# Patient Record
Sex: Male | Born: 1945 | Race: White | Hispanic: No | Marital: Married | Smoking: Former smoker
Health system: Southern US, Community
[De-identification: ages and names within clinical notes are randomized; demographics above are authoritative.]

## PROBLEM LIST (undated history)

## (undated) ENCOUNTER — Emergency Department (HOSPITAL_COMMUNITY): Admission: EM | Payer: Self-pay

## (undated) DIAGNOSIS — K219 Gastro-esophageal reflux disease without esophagitis: Secondary | ICD-10-CM

## (undated) DIAGNOSIS — I10 Essential (primary) hypertension: Secondary | ICD-10-CM

## (undated) DIAGNOSIS — I499 Cardiac arrhythmia, unspecified: Secondary | ICD-10-CM

## (undated) DIAGNOSIS — C801 Malignant (primary) neoplasm, unspecified: Secondary | ICD-10-CM

## (undated) DIAGNOSIS — M1990 Unspecified osteoarthritis, unspecified site: Secondary | ICD-10-CM

## (undated) DIAGNOSIS — R51 Headache: Secondary | ICD-10-CM

## (undated) DIAGNOSIS — N189 Chronic kidney disease, unspecified: Secondary | ICD-10-CM

## (undated) DIAGNOSIS — D62 Acute posthemorrhagic anemia: Secondary | ICD-10-CM

## (undated) DIAGNOSIS — R0602 Shortness of breath: Secondary | ICD-10-CM

## (undated) HISTORY — PX: TONSILLECTOMY: SUR1361

---

## 2005-11-09 HISTORY — PX: CARDIAC CATHETERIZATION: SHX172

## 2007-06-11 ENCOUNTER — Ambulatory Visit: Payer: Self-pay | Admitting: Internal Medicine

## 2008-05-05 ENCOUNTER — Ambulatory Visit: Payer: Self-pay | Admitting: Family Medicine

## 2008-05-07 ENCOUNTER — Ambulatory Visit: Payer: Self-pay | Admitting: Family Medicine

## 2008-05-09 ENCOUNTER — Ambulatory Visit: Payer: Self-pay | Admitting: Family Medicine

## 2010-05-06 ENCOUNTER — Ambulatory Visit: Payer: Self-pay | Admitting: Family Medicine

## 2011-11-10 HISTORY — PX: CARDIAC CATHETERIZATION: SHX172

## 2012-02-08 DIAGNOSIS — C801 Malignant (primary) neoplasm, unspecified: Secondary | ICD-10-CM

## 2012-02-08 HISTORY — DX: Malignant (primary) neoplasm, unspecified: C80.1

## 2013-08-30 ENCOUNTER — Ambulatory Visit: Payer: Self-pay | Admitting: Family Medicine

## 2013-08-30 LAB — BASIC METABOLIC PANEL
Anion Gap: 7 (ref 7–16)
BUN: 21 mg/dL — ABNORMAL HIGH (ref 7–18)
Calcium, Total: 8.8 mg/dL (ref 8.5–10.1)
Chloride: 108 mmol/L — ABNORMAL HIGH (ref 98–107)
Co2: 30 mmol/L (ref 21–32)
Creatinine: 1.53 mg/dL — ABNORMAL HIGH (ref 0.60–1.30)
EGFR (African American): 53 — ABNORMAL LOW
Glucose: 107 mg/dL — ABNORMAL HIGH (ref 65–99)
Potassium: 4.5 mmol/L (ref 3.5–5.1)
Sodium: 145 mmol/L (ref 136–145)

## 2013-09-06 ENCOUNTER — Ambulatory Visit: Payer: Self-pay | Admitting: Family Medicine

## 2013-11-04 ENCOUNTER — Inpatient Hospital Stay (HOSPITAL_COMMUNITY)
Admission: AD | Admit: 2013-11-04 | Discharge: 2013-11-10 | DRG: 394 | Disposition: A | Discharge status: Rehabilitation Facility | Payer: Medicare Other | Source: Other Acute Inpatient Hospital | Attending: Internal Medicine | Admitting: Internal Medicine

## 2013-11-04 ENCOUNTER — Encounter (HOSPITAL_COMMUNITY): Payer: Self-pay | Admitting: *Deleted

## 2013-11-04 ENCOUNTER — Emergency Department: Payer: Self-pay | Admitting: Emergency Medicine

## 2013-11-04 DIAGNOSIS — I5032 Chronic diastolic (congestive) heart failure: Secondary | ICD-10-CM | POA: Diagnosis present

## 2013-11-04 DIAGNOSIS — Z923 Personal history of irradiation: Secondary | ICD-10-CM

## 2013-11-04 DIAGNOSIS — I10 Essential (primary) hypertension: Secondary | ICD-10-CM

## 2013-11-04 DIAGNOSIS — E669 Obesity, unspecified: Secondary | ICD-10-CM | POA: Diagnosis present

## 2013-11-04 DIAGNOSIS — I2699 Other pulmonary embolism without acute cor pulmonale: Secondary | ICD-10-CM

## 2013-11-04 DIAGNOSIS — Z79899 Other long term (current) drug therapy: Secondary | ICD-10-CM

## 2013-11-04 DIAGNOSIS — R5381 Other malaise: Secondary | ICD-10-CM | POA: Diagnosis present

## 2013-11-04 DIAGNOSIS — I4891 Unspecified atrial fibrillation: Secondary | ICD-10-CM | POA: Diagnosis present

## 2013-11-04 DIAGNOSIS — E875 Hyperkalemia: Secondary | ICD-10-CM | POA: Diagnosis present

## 2013-11-04 DIAGNOSIS — I2510 Atherosclerotic heart disease of native coronary artery without angina pectoris: Secondary | ICD-10-CM | POA: Diagnosis present

## 2013-11-04 DIAGNOSIS — Z86711 Personal history of pulmonary embolism: Secondary | ICD-10-CM | POA: Diagnosis present

## 2013-11-04 DIAGNOSIS — Z9221 Personal history of antineoplastic chemotherapy: Secondary | ICD-10-CM

## 2013-11-04 DIAGNOSIS — Z86718 Personal history of other venous thrombosis and embolism: Secondary | ICD-10-CM

## 2013-11-04 DIAGNOSIS — Z87891 Personal history of nicotine dependence: Secondary | ICD-10-CM

## 2013-11-04 DIAGNOSIS — D689 Coagulation defect, unspecified: Secondary | ICD-10-CM | POA: Diagnosis present

## 2013-11-04 DIAGNOSIS — I82402 Acute embolism and thrombosis of unspecified deep veins of left lower extremity: Secondary | ICD-10-CM

## 2013-11-04 DIAGNOSIS — N183 Chronic kidney disease, stage 3 unspecified: Secondary | ICD-10-CM | POA: Diagnosis present

## 2013-11-04 DIAGNOSIS — K219 Gastro-esophageal reflux disease without esophagitis: Secondary | ICD-10-CM | POA: Diagnosis present

## 2013-11-04 DIAGNOSIS — T45515A Adverse effect of anticoagulants, initial encounter: Secondary | ICD-10-CM | POA: Diagnosis present

## 2013-11-04 DIAGNOSIS — M109 Gout, unspecified: Secondary | ICD-10-CM | POA: Diagnosis present

## 2013-11-04 DIAGNOSIS — R58 Hemorrhage, not elsewhere classified: Secondary | ICD-10-CM | POA: Diagnosis present

## 2013-11-04 DIAGNOSIS — I82409 Acute embolism and thrombosis of unspecified deep veins of unspecified lower extremity: Secondary | ICD-10-CM | POA: Diagnosis present

## 2013-11-04 DIAGNOSIS — I517 Cardiomegaly: Secondary | ICD-10-CM | POA: Diagnosis present

## 2013-11-04 DIAGNOSIS — I509 Heart failure, unspecified: Secondary | ICD-10-CM | POA: Diagnosis present

## 2013-11-04 DIAGNOSIS — R791 Abnormal coagulation profile: Secondary | ICD-10-CM | POA: Diagnosis present

## 2013-11-04 DIAGNOSIS — D62 Acute posthemorrhagic anemia: Secondary | ICD-10-CM | POA: Diagnosis present

## 2013-11-04 DIAGNOSIS — I129 Hypertensive chronic kidney disease with stage 1 through stage 4 chronic kidney disease, or unspecified chronic kidney disease: Secondary | ICD-10-CM | POA: Diagnosis present

## 2013-11-04 DIAGNOSIS — K661 Hemoperitoneum: Principal | ICD-10-CM | POA: Diagnosis present

## 2013-11-04 DIAGNOSIS — Z8546 Personal history of malignant neoplasm of prostate: Secondary | ICD-10-CM

## 2013-11-04 HISTORY — DX: Shortness of breath: R06.02

## 2013-11-04 HISTORY — DX: Headache: R51

## 2013-11-04 HISTORY — DX: Essential (primary) hypertension: I10

## 2013-11-04 HISTORY — DX: Gastro-esophageal reflux disease without esophagitis: K21.9

## 2013-11-04 HISTORY — DX: Acute posthemorrhagic anemia: D62

## 2013-11-04 HISTORY — DX: Chronic kidney disease, unspecified: N18.9

## 2013-11-04 HISTORY — DX: Malignant (primary) neoplasm, unspecified: C80.1

## 2013-11-04 HISTORY — DX: Cardiac arrhythmia, unspecified: I49.9

## 2013-11-04 HISTORY — DX: Unspecified osteoarthritis, unspecified site: M19.90

## 2013-11-04 LAB — CBC
HCT: 37.2 % — ABNORMAL LOW (ref 39.0–52.0)
HGB: 11.8 g/dL — ABNORMAL LOW (ref 13.0–18.0)
MCH: 32.5 pg (ref 26.0–34.0)
MCH: 32.7 pg (ref 26.0–34.0)
MCV: 99.7 fL (ref 78.0–100.0)
Platelet: 188 10*3/uL (ref 150–440)
Platelets: 162 10*3/uL (ref 150–400)
RBC: 3.62 10*6/uL — ABNORMAL LOW (ref 4.40–5.90)
RBC: 3.73 MIL/uL — ABNORMAL LOW (ref 4.22–5.81)
RDW: 14.5 % (ref 11.5–15.5)
WBC: 7.5 10*3/uL (ref 3.8–10.6)
WBC: 12.6 10*3/uL — ABNORMAL HIGH (ref 4.0–10.5)

## 2013-11-04 LAB — BASIC METABOLIC PANEL
BUN: 19 mg/dL — ABNORMAL HIGH (ref 7–18)
Calcium, Total: 7.5 mg/dL — ABNORMAL LOW (ref 8.5–10.1)
Co2: 26 mmol/L (ref 21–32)
EGFR (Non-African Amer.): 42 — ABNORMAL LOW
Osmolality: 295 (ref 275–301)
Potassium: 4 mmol/L (ref 3.5–5.1)
Sodium: 145 mmol/L (ref 136–145)

## 2013-11-04 LAB — PHOSPHORUS: Phosphorus: 3.3 mg/dL (ref 2.3–4.6)

## 2013-11-04 LAB — COMPREHENSIVE METABOLIC PANEL
ALT: 15 U/L (ref 0–53)
AST: 20 U/L (ref 0–37)
Albumin: 3.4 g/dL — ABNORMAL LOW (ref 3.5–5.2)
Alkaline Phosphatase: 47 U/L (ref 39–117)
CO2: 23 mEq/L (ref 19–32)
Calcium: 8 mg/dL — ABNORMAL LOW (ref 8.4–10.5)
Creatinine, Ser: 1.44 mg/dL — ABNORMAL HIGH (ref 0.50–1.35)
GFR calc Af Amer: 56 mL/min — ABNORMAL LOW (ref >90)
GFR calc non Af Amer: 48 mL/min — ABNORMAL LOW (ref >90)
Glucose, Bld: 127 mg/dL — ABNORMAL HIGH (ref 70–99)
Potassium: 5.7 mEq/L — ABNORMAL HIGH (ref 3.5–5.1)
Sodium: 142 mEq/L (ref 135–145)
Total Protein: 6.3 g/dL (ref 6.0–8.3)

## 2013-11-04 LAB — PROTIME-INR
INR: 1.3
INR: 1.06 (ref 0.00–1.49)
Prothrombin Time: 13.6 seconds (ref 11.6–15.2)

## 2013-11-04 LAB — TYPE AND SCREEN: ABO/RH(D): O POS

## 2013-11-04 LAB — APTT: aPTT: 26 seconds (ref 24–37)

## 2013-11-04 LAB — MRSA PCR SCREENING: MRSA by PCR: NEGATIVE

## 2013-11-04 LAB — TROPONIN I
Troponin I: <0.3 ng/mL (ref <0.30)
Troponin-I: <0.02 ng/mL

## 2013-11-04 LAB — ABO/RH: ABO/RH(D): O POS

## 2013-11-04 LAB — MAGNESIUM: Magnesium: 1.8 mg/dL (ref 1.5–2.5)

## 2013-11-04 MED ORDER — PANTOPRAZOLE SODIUM 40 MG IV SOLR
40.0000 mg | Freq: Every day | INTRAVENOUS | Status: DC
  Filled 2013-11-04: qty 40

## 2013-11-04 MED ORDER — MORPHINE SULFATE 2 MG/ML IJ SOLN
2.0000 mg | INTRAMUSCULAR | Status: DC | PRN
  Administered 2013-11-04 (×2): 2 mg via INTRAVENOUS
  Filled 2013-11-04 (×2): qty 1

## 2013-11-04 MED ORDER — HYDROMORPHONE HCL PF 1 MG/ML IJ SOLN
0.5000 mg | INTRAMUSCULAR | Status: DC | PRN
  Administered 2013-11-04 (×2): 1 mg via INTRAVENOUS
  Administered 2013-11-05: 0.5 mg via INTRAVENOUS
  Administered 2013-11-05: 1 mg via INTRAVENOUS
  Administered 2013-11-05 – 2013-11-06 (×7): 0.5 mg via INTRAVENOUS
  Filled 2013-11-04 (×8): qty 1

## 2013-11-04 MED ORDER — SODIUM CHLORIDE 0.9 % IV SOLN
INTRAVENOUS | Status: DC
  Administered 2013-11-04 – 2013-11-05 (×2): via INTRAVENOUS

## 2013-11-04 MED ORDER — FENTANYL CITRATE 0.05 MG/ML IJ SOLN
25.0000 ug | INTRAMUSCULAR | Status: DC | PRN
  Filled 2013-11-04: qty 2

## 2013-11-04 MED ORDER — ONDANSETRON HCL 4 MG/2ML IJ SOLN
INTRAMUSCULAR | Status: AC
  Administered 2013-11-04: 4 mg
  Filled 2013-11-04: qty 2

## 2013-11-04 MED ORDER — ONDANSETRON HCL 4 MG/2ML IJ SOLN: 4.0000 mg | Freq: Four times a day (QID) | INTRAMUSCULAR | Status: DC | PRN

## 2013-11-04 MED ORDER — BIOTENE DRY MOUTH MT LIQD
15.0000 mL | Freq: Two times a day (BID) | OROMUCOSAL | Status: DC
  Administered 2013-11-04 – 2013-11-10 (×8): 15 mL via OROMUCOSAL

## 2013-11-04 NOTE — Progress Notes (Signed)
VASCULAR LAB PRELIMINARY  PRELIMINARY  PRELIMINARY  PRELIMINARY  Bilateral lower extremity venous Dopplers completed.    Preliminary report:  There is no DVT or SVT noted in the bilateral lower extremities.  Jude Naclerio, RVT 11/04/2013, 6:15 PM

## 2013-11-04 NOTE — Plan of Care (Signed)
Problem: Phase I Progression Outcomes Goal: Pain controlled with appropriate interventions Outcome: Progressing Pain meds changed, pain now better controlled

## 2013-11-04 NOTE — H&P (Signed)
PULMONARY / CRITICAL CARE MEDICINE  Name: Jeff Henson MRN: [id number] DOB: 06/30/1946    ADMISSION DATE:  11/04/2013 CONSULTATION DATE: 11/04/2013  REFERRING MD :  EDP ARMH PRIMARY SERVICE: PCCM  CHIEF COMPLAINT:  Chest pain  BRIEF PATIENT DESCRIPTION: 67 yo with h/o AF and PE ( on Eliquis ) who presented to [facility] 12/27 with chest / back pain. CT chest revealed retroperitoneal hemorrhage. FIBA was give, PRBC transfused and patient was transferred to [facility] for further management.  SIGNIFICANT EVENTS / STUDIES:  12/27  Chest CT (outside) >>> retroperitoneal hemorrhage 12/27  Transfused PRBC 12/27  Transferred to [facility] for further management  LINES / TUBES:  CULTURES:  ANTIBIOTICS:  HISTORY OF PRESENT ILLNESS: 67 yo with h/o AF and PE ( on Eliquis ) who presented to [facility] 12/27 with chest / back pain. CT chest revealed retroperitoneal hemorrhage. FIBA was give, PRBC transfused and patient was transferred to [facility] for further management.  PAST MEDICAL HISTORY :  Diastolic heart failure 10/02/2013  Overview:  08/2013 Echo: Clinical Diagnoses and Echocardiographic Findings Systemic hypertension Chronic kidney disease Left ventricular hypertrophy Dilated left ventricle Normal left ventricular ejection fraction (55-60%) Diastolic left ventricular dysfunction Degenerative mitral valve disease Mitral regurgitation (trivial) Dilated left atrium Aortic sclerosis Dilated thoracic aorta (see detail below) Aortic regurgitation (mild) Pulmonary regurgitation (trivial) Normal right ventricular contractile performance Tricuspid regurgitation (trivial)  Prolonged Q-T interval on ECG 10/02/2013  Last Assessment & Plan:  - prolonged QTc 478 on EKG today - patient has h/o prolonged QT interval - per cards recs, will check EKG q6 months - avoid QT prolonging medications Headache 10/02/2013  Last Assessment & Plan:  - may be related to tension, stress. Or, may be related to obesity, increased  neck girth, daytime sleepiness, snoring. Will rule out OSA with sleep study and treat if appropriate. - patient asked to try 800mg  ibu profen once or twice with this headache to see if it response to NSAIDS. Patient advised to communicate with PCP via [facility]. May consider other migraine treatments if NSAIDS effective. Will avoid recurrent use of NSAIDS 2/2 anticoagulation on eliquis (increased bleeding risk), increased CV risk with NSAIDS, and need to protect renal function. - patient given HA diary to keep track of symptoms and triggers. Vision changes 07/24/2013  Last Assessment & Plan:  - ddx includes temporal arteritis, migraine with aura, TIAs. Less likely optic neuritis (vision is non painful), cataracts (patient has h/o cataracts s/p repair). - lateral vision "waviness" associated with mild, transient HA, dizziness, and nausea seems to make migraine with aura a possibility. However, patient says HAs resolve quickly and are not like migraines he's had in the past. - orthostatics negative today. - TIAs possible, though history not consistent with usual TIAs since bilateral and only lateral vision fields affected. - With new HAs, temple pain, and vision changes, concern for giant cell arteritis. ESR and CRP today. If abnormal, will get stat optho referral. If normal, will get routine referral to optho for evaluation for vision changes. 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 - per cardiology, patient to get bloodwork today to evaluation tikosyn and eliquis. Also got EKG which was normal except for mildly prolonged (478) QTc BPH  (  benign prostatic hyperplasia) 05/22/2010 Hypertension 05/22/2010  Last Assessment & Plan:  - above goal <150/90 - patient recently seen by cardiology who continued his current regimen. - continue metoprolol 200mg  daily, hydralazine 25mg  TID, lisinopril 20mg  daily. - Avoid increasing ACE 2/2 CKD III. - refills as requested today. Chronic kidney disease, stage III (moderate) 05/22/2010  Medications:    Prescription Sig. Disp. Refills Start Date End Date Status acetaminophen (TYLENOL) 500 MG tablet Take 1,000 mg by mouth Two (2) times a day. Active apixaban (ELIQUIS) 5 mg Tab Take 1 tablet (5 mg total) by mouth Two (2) times a day.  60 tablet 5 07/24/2013 Active dofetilide (TIKOSYN) 250 MCG capsule Take 1 capsule (250 mcg total) by mouth every twelve (12) hours.  60 capsule 02 08/18/2013 Active furosemide (LASIX) 20 MG tablet Take 1 tablet (20 mg total) by mouth daily as needed for swelling. for up to 10 doses  10 tablet 0 08/31/2013 Active hydrALAZINE (APRESOLINE) 25 MG tablet Take 1 tablet (25 mg total) by mouth Three (3) times a day.  90 tablet 5 07/24/2013 Active lisinopril (PRINIVIL,ZESTRIL) 20 MG tablet Take 20 mg by mouth. Frequency:QD Dosage:20 MG Instructions: Note:Dose: 20MG  12/30/2012 Active metoprolol succinate (TOPROL-XL) 200 MG 24 hr tablet  Allergies: Levaquin Pentazocine-Naloxene Rivaroxaban  FAMILY HISTORY:  Family History  Patient is adopted Medical History Relation Name Comments  Hypertension Mother    Diabetes Mother    Obesity Mother    Social History  Tobacco Use Types Packs/Day Years Used Date Comments  Former smoker        Alcohol Use Drinks/Week Oz/Week Comments  No      SOCIAL HISTORY: Married, never smoker , drinker  REVIEW OF SYSTEMS:  10 point review of system taken, please see HPI for positives and negatives.  INTERVAL HISTORY:   VITAL SIGNS: Temp:  [98.5 F (36.9 C)] 98.5 F (36.9 C) (12/27 1432) Pulse Rate:  [87] 87 (12/27 1432) BP:  (129)/(62) 129/62 mmHg (12/27 1432) SpO2:  [2 %] 2 % (12/27 1432) Weight:  [122.471 kg (270 lb)] 122.471 kg (270 lb) (12/27 1432) HEMODYNAMICS:   VENTILATOR SETTINGS:   INTAKE / OUTPUT: Intake/Output   None     PHYSICAL EXAMINATION: General:  Obese white male in NAD Neuro:  Intact HEENT:  No JVD Cardiovascular:  HSIR IR Lungs:  Decreased bs bases Abdomen:  Distended, tender LOQ Musculoskeletal:  Inact Skin:  Warm , areas of ecchymosis  CBC No results found for this basename: WBC, HGB, HCT, PLT,  in the last 168 hours Coag's No results found for this basename: APTT, INR,  in the last 168 hours BMET No results found for this basename: NA, K, CL, CO2, BUN, CREATININE, GLUCOSE,  in the last 168 hours Electrolytes No results found for this basename: CALCIUM, MG, PHOS,  in the last 168 hours Sepsis Markers No results found for this basename: LATICACIDVEN, PROCALCITON, O2SATVEN,  in the last 168 hours ABG No results found for this basename: PHART, PCO2ART, PO2ART,  in the last 168 hours Liver Enzymes No results found for this basename: AST, ALT, ALKPHOS, BILITOT, ALBUMIN,  in the last 168 hours Cardiac Enzymes No results found for this basename: TROPONINI, PROBNP,  in the last 168 hours Glucose No results found for this basename: GLUCAP,  in the last 168 hours  CXR:  None today  ASSESSMENT / PLAN:  PULMONARY A:  No active issues. P:   Goal SpO2>92 Supplemental oxygen PRN  CARDIOVASCULAR A:  Retroperitoneal hemorrhage on  Eliquis. Hemodynamically stable. History of LLE DVT 6 years (after admitted for cardiac ablation) and PE / LLE DVT (recurrent?) 1 year ago (while on Coumadin !) P:  Goal MAP>65 Assure 2 large bore IVs Hold Eliquis Hold antihypertensives Monitor clinically Venous Doppler LE BL If DVT, place IVC filter - that has been discussed with Interventional Radiologist on call  RENAL A:  CKD P:   Trend BMP NS@75   GASTROINTESTINAL A: No active  issues. P:   NPO as may need procedures GI Px is not indicated  HEMATOLOGIC A: Coagulopathy.  Acute blood loss anemia. P:  Trend CBC Recheck coags Received FEIBA Received PRBC Transfuse for HB<10 as actively bleeding Type and screen  INFECTIOUS A:  No acute issues. P:   No intervention required  ENDOCRINE A: No acute issues. P:   No intervention required  NEUROLOGIC A: Pain. P:   Morphine PRN  Brett Canales Minor ACNP Ad[facility] Pollack PCCM Pager [phone] till 3 pm If no answer page [phone] 11/04/2013, 3:24 PM  I have personally obtained history, examined patient, evaluated and interpreted laboratory and imaging results, reviewed medical records, formulated assessment / plan and placed orders.  CRITICAL CARE:  The patient is critically ill with multiple organ systems failure and requires high complexity decision making for assessment and support, frequent evaluation and titration of therapies, application of advanced monitoring technologies and extensive interpretation of multiple databases. Critical Care Time devoted to patient care services described in this note is 35 minutes.   Lonia Farber, MD Pulmonary and Critical Care Medicine [facility] Pager: [phone]  11/04/2013, 4:33 PM

## 2013-11-05 LAB — CBC
HCT: 36.4 % — ABNORMAL LOW (ref 39.0–52.0)
HCT: 36 % — ABNORMAL LOW (ref 39.0–52.0)
Hemoglobin: 11.7 g/dL — ABNORMAL LOW (ref 13.0–17.0)
MCH: 33 pg (ref 26.0–34.0)
MCH: 32.6 pg (ref 26.0–34.0)
MCHC: 32.1 g/dL (ref 30.0–36.0)
MCHC: 31.1 g/dL (ref 30.0–36.0)
MCV: 104.7 fL — ABNORMAL HIGH (ref 78.0–100.0)
Platelets: 155 10*3/uL (ref 150–400)
RDW: 14.7 % (ref 11.5–15.5)
RDW: 14.5 % (ref 11.5–15.5)

## 2013-11-05 LAB — BASIC METABOLIC PANEL
BUN: 20 mg/dL (ref 6–23)
CO2: 26 mEq/L (ref 19–32)
Calcium: 7.9 mg/dL — ABNORMAL LOW (ref 8.4–10.5)
Chloride: 109 mEq/L (ref 96–112)
Creatinine, Ser: 1.53 mg/dL — ABNORMAL HIGH (ref 0.50–1.35)
GFR calc non Af Amer: 45 mL/min — ABNORMAL LOW (ref >90)
Glucose, Bld: 135 mg/dL — ABNORMAL HIGH (ref 70–99)
Sodium: 141 mEq/L (ref 135–145)

## 2013-11-05 LAB — URINALYSIS, ROUTINE W REFLEX MICROSCOPIC
Bilirubin Urine: NEGATIVE
Glucose, UA: NEGATIVE mg/dL
Leukocytes, UA: NEGATIVE
Nitrite: NEGATIVE
Protein, ur: NEGATIVE mg/dL
Specific Gravity, Urine: 1.044 — ABNORMAL HIGH (ref 1.005–1.030)
Urobilinogen, UA: 0.2 mg/dL (ref 0.0–1.0)

## 2013-11-05 MED ORDER — DOFETILIDE 250 MCG PO CAPS
250.0000 ug | ORAL_CAPSULE | Freq: Two times a day (BID) | ORAL | Status: DC
  Administered 2013-11-05 – 2013-11-10 (×10): 250 ug via ORAL
  Filled 2013-11-05 (×15): qty 1

## 2013-11-05 NOTE — Progress Notes (Signed)
PULMONARY / CRITICAL CARE MEDICINE  Name: Jeff Henson MRN: [id number] DOB: 16-Nov-1945    ADMISSION DATE:  11/04/2013 CONSULTATION DATE: 11/04/2013  REFERRING MD :  EDP ARMH PRIMARY SERVICE: PCCM  CHIEF COMPLAINT:  Chest pain  BRIEF PATIENT DESCRIPTION: 67 yo with h/o AF and PE ( on Eliquis ) who presented to [facility] 12/27 with chest / back pain. CT chest revealed retroperitoneal hemorrhage. FIBA was give, PRBC transfused and patient was transferred to [facility] for further management.  SIGNIFICANT EVENTS / STUDIES:  12/27  Chest CT (outside) >>> retroperitoneal hemorrhage 12/27  Transfused PRBC 12/27  Transferred to [facility] for further management 12/27 duplex neg DVT  LINES / TUBES:  CULTURES:  ANTIBIOTICS:   PAST MEDICAL HISTORY :  Diastolic heart failure 10/02/2013  Overview:  08/2013 Echo: Clinical Diagnoses and Echocardiographic Findings Systemic hypertension Chronic kidney disease Left ventricular hypertrophy Dilated left ventricle Normal left ventricular ejection fraction (55-60%) Diastolic left ventricular dysfunction Degenerative mitral valve disease Mitral regurgitation (trivial) Dilated left atrium Aortic sclerosis Dilated thoracic aorta (see detail below) Aortic regurgitation (mild) Pulmonary regurgitation (trivial) Normal right ventricular contractile performance Tricuspid regurgitation (trivial)  Prolonged Q-T interval on ECG 10/02/2013   Allergies: Levaquin Pentazocine-Naloxene Rivaroxaban  INTERVAL HISTORY: c/o nausea, some abd pain afebrile  VITAL SIGNS: Temp:  [97.8 F (36.6 C)-99.2 F (37.3 C)] 98.6 F (37 C) (12/28 0715) Pulse Rate:  [67-91] 80 (12/28 0800) Resp:  [11-20] 16 (12/28 0800) BP: (112-138)/(57-97) 134/60 mmHg (12/28 0800) SpO2:  [2 %-100 %] 100 % (12/28 0800) Weight:  [122.471 kg (270 lb)-125 kg (275 lb 9.2 oz)] 125 kg (275 lb 9.2 oz) (12/28 0316) HEMODYNAMICS:   VENTILATOR SETTINGS:   INTAKE / OUTPUT: Intake/Output   12/27 0701 - 12/28 0700 12/28 0701 - 12/29 0700   I.V. (mL/kg) 1125 (9) 75 (0.6)   Total Intake(mL/kg) 1125 (9) 75 (0.6)   Urine (mL/kg/hr) 600    Total Output 600     Net +525 +75          PHYSICAL EXAMINATION: General:  Obese white male in NAD Neuro:  Intact HEENT:  No JVD Cardiovascular:  HSIR IR Lungs:  Decreased bs bases Abdomen:  Distended, tender LOQ Musculoskeletal:  Inact Skin:  Warm , areas of ecchymosis  CBC  Recent Labs Lab 11/04/13 2020 11/05/13 0120  WBC 12.6* 12.4*  HGB 12.2* 11.7*  HCT 37.2* 36.4*  PLT 162 180   Coag's  Recent Labs Lab 11/04/13 1700  APTT 26  INR 1.06   BMET  Recent Labs Lab 11/04/13 1700 11/05/13 0120  NA 142 141  K 5.7* 5.7*  CL 110 109  CO2 23 26  BUN 19 20  CREATININE 1.44* 1.53*  GLUCOSE 127* 135*   Electrolytes  Recent Labs Lab 11/04/13 1700 11/05/13 0120  CALCIUM 8.0* 7.9*  MG 1.8  --   PHOS 3.3  --    Sepsis Markers No results found for this basename: LATICACIDVEN, PROCALCITON, O2SATVEN,  in the last 168 hours ABG No results found for this basename: PHART, PCO2ART, PO2ART,  in the last 168 hours Liver Enzymes  Recent Labs Lab 11/04/13 1700  AST 20  ALT 15  ALKPHOS 47  BILITOT 0.5  ALBUMIN 3.4*   Cardiac Enzymes  Recent Labs Lab 11/04/13 2020  TROPONINI <0.30   Glucose No results found for this basename: GLUCAP,  in the last 168 hours  CXR:  None today  ASSESSMENT / PLAN:  PULMONARY A:  No active issues. P:  Goal SpO2>92 Supplemental oxygen PRN  CARDIOVASCULAR A:  Retroperitoneal hemorrhage on Eliquis. Hemodynamically stable. History of LLE DVT 6 years (after admitted for cardiac ablation) and PE / LLE DVT (recurrent?) 09/2012 (while on Coumadin !) P:  Hold Eliquis Hold antihypertensives Monitor clinically No indication for  IVC filter - since duplex neg  RENAL A:  CKD P:   Trend BMP kvo IVFs  GASTROINTESTINAL A: No active issues. P:   Advance PO -liquid diet for  nausea GI Px is not indicated  HEMATOLOGIC A: Coagulopathy.  Acute blood loss anemia. P:  Trend CBC Received FEIBA Received PRBC Type and screen  INFECTIOUS A:  No acute issues. P:   No intervention required  ENDOCRINE A: No acute issues. P:   No intervention required  NEUROLOGIC A: Pain. P:   Morphine PRN  Ok tot ransfer tot ele & [facility] in am  11/05/2013, 8:30 AM    CRITICAL CARE:  The patient is critically ill with multiple organ systems failure and requires high complexity decision making for assessment and support, frequent evaluation and titration of therapies, application of advanced monitoring technologies and extensive interpretation of multiple databases. Critical Care Time devoted to patient care services described in this note is 31 minutes.   Oretha Milch., MD Cyril Mourning MD. FCCP. [facility] Pager [phone] If no response call [phone]    11/05/2013, 8:30 AM

## 2013-11-06 DIAGNOSIS — I5032 Chronic diastolic (congestive) heart failure: Secondary | ICD-10-CM | POA: Diagnosis present

## 2013-11-06 DIAGNOSIS — I517 Cardiomegaly: Secondary | ICD-10-CM | POA: Diagnosis present

## 2013-11-06 DIAGNOSIS — I10 Essential (primary) hypertension: Secondary | ICD-10-CM | POA: Diagnosis present

## 2013-11-06 DIAGNOSIS — I4891 Unspecified atrial fibrillation: Secondary | ICD-10-CM | POA: Diagnosis present

## 2013-11-06 LAB — CBC
MCH: 32.6 pg (ref 26.0–34.0)
MCHC: 31.5 g/dL (ref 30.0–36.0)
Platelets: 158 10*3/uL (ref 150–400)
Platelets: 150 10*3/uL (ref 150–400)
RBC: 3.36 MIL/uL — ABNORMAL LOW (ref 4.22–5.81)
RDW: 13.6 % (ref 11.5–15.5)
WBC: 16.2 10*3/uL — ABNORMAL HIGH (ref 4.0–10.5)

## 2013-11-06 LAB — URIC ACID: Uric Acid, Serum: 8.1 mg/dL — ABNORMAL HIGH (ref 4.0–7.8)

## 2013-11-06 MED ORDER — LORAZEPAM 2 MG/ML IJ SOLN
1.0000 mg | Freq: Once | INTRAMUSCULAR | Status: AC
  Administered 2013-11-06: 1 mg via INTRAVENOUS
  Filled 2013-11-06: qty 1

## 2013-11-06 MED ORDER — METOPROLOL SUCCINATE ER 100 MG PO TB24
200.0000 mg | ORAL_TABLET | Freq: Every day | ORAL | Status: DC
  Administered 2013-11-06 – 2013-11-10 (×5): 200 mg via ORAL
  Filled 2013-11-06 (×6): qty 2

## 2013-11-06 NOTE — Progress Notes (Signed)
Portable notified of need for continuous SpO2. Will continue to monitor.

## 2013-11-06 NOTE — Progress Notes (Signed)
Pt arrived to unit accompanied by RN and NT. Placed on tele, CMT Sonya notified. Will continue to monitor.

## 2013-11-06 NOTE — Progress Notes (Signed)
Pt arrived to unit with slight confusion, able to answer questions appropriately, but with poor memory, forgetful of time/place/situation. Per wife this is new as of this afternoon, and per spouse is increasing in severity. Will notify night coverage and continue to monitor. Pt moved closer to RN station for safety.

## 2013-11-06 NOTE — Progress Notes (Signed)
Utilization Review Completed.Jeff Henson T12/29/2014  

## 2013-11-06 NOTE — Progress Notes (Signed)
[facility] Progress Note [place] TEAM 1 - Stepdown ICU Team   Jeff Henson WNU:[id number] DOB: 12/16/1945 DOA: 11/04/2013 PCP: No PCP Per Patient  Brief narrative: 67 year old male with known history of atrial fibrillation and prior DVT and PE on Eliquis prior to admission. He presented to [facility] on 11/04/2013 with chest and back pain. CT of the chest revealed a retroperitoneal hemorrhage. Eliquis was reversed with FIBA. In addition patient was transfused one unit of packed red blood cells and transferred to [facility] for further management. He has remained stable over 24 hours and was deemed appropriate for transfer to a telemetry unit.  Assessment/Plan:    Retroperitoneal bleeding -Symptomatic treatment since this is self-limiting process -hold anti coagulation for at least 3-4 weeks (risk outweighs benefit)    Acute blood loss anemia - due to above -hgb has drifted down to 10.9  -follow Hgb to assure stability / resolution of bleeding    Coagulopathy -due to Eliquis - now corrected     Atrial fibrillation -maintaining NSR on Tikosyn and Metoprolol -not a candidate for anti coagulation for a minimum of 2-4 weeks   R foot pain -very tender over dorsum -given hemolysis associated with RPH high risk for gout -check uric acid and follow clinically    Chronic diastolic heart failure/ Left ventricular dilatation -compensated    History of pulmonary embolism/DVT (deep venous thrombosis) -PCCM performed LE duplex- since no acute DVT no indication for IVC filter at this time    HTN  -BP controlled -cont BB    CKD stage 3, GFR 30-59 ml/min -Scr climbing - will need to follow trend     Hyperkalemia -likely due to blood break down in large RPH - follow trend    DVT prophylaxis: SCDs Code Status: Full Family Communication: Wife at bedside Disposition Plan/Expected LOS: Transfer to telemetry - begin PT/OT - follow Hgb trend    Consultants: PCCM  Procedures: Bilateral Lower Extremity Venous Duplex - Negative for DVT   Antibiotics: None  HPI/Subjective: Patient alert and primarily complaining of extensive discomfort dorsum right foot.  Denies cp, sob, or f/c.  Continues to have pain in R CVA region radiating around R flank.  Objective: Blood pressure 153/69, pulse 93, temperature 99.1 F (37.3 C), temperature source Oral, resp. rate 20, height 5\' 10"  (1.778 m), weight 276 lb 14.4 oz (125.6 kg), SpO2 95.00%.  Intake/Output Summary (Last 24 hours) at 11/06/13 1438 Last data filed at 11/06/13 1300  Gross per 24 hour  Intake    600 ml  Output   1450 ml  Net   -850 ml   Exam: General: No acute respiratory distress Lungs: Clear to auscultation bilaterally without wheezes or crackles, 2L Cardiovascular: Regular rate and rhythm without murmur gallop or rub normal S1 and S2, no peripheral edema or JVD Abdomen: Nontender, nondistended, soft, bowel sounds positive, no rebound, no ascites, no appreciable mass Musculoskeletal: No significant cyanosis, clubbing of bilateral lower extremities-tender over dorsum right foot without erythema or swelling Neurological: Alert and oriented x 3, moves all extremities x 4 without focal neurological deficits, CN 2-12 intact  Scheduled Meds:  Scheduled Meds: . antiseptic oral rinse  15 mL Mouth Rinse BID  . dofetilide  250 mcg Oral BID  . metoprolol  200 mg Oral Daily   Data Reviewed: Basic Metabolic Panel:  Recent Labs Lab 11/04/13 1700 11/05/13 0120  NA 142 141  K 5.7* 5.7*  CL 110 109  CO2 23 26  GLUCOSE  127* 135*  BUN 19 20  CREATININE 1.44* 1.53*  CALCIUM 8.0* 7.9*  MG 1.8  --   PHOS 3.3  --    Liver Function Tests:  Recent Labs Lab 11/04/13 1700  AST 20  ALT 15  ALKPHOS 47  BILITOT 0.5  PROT 6.3  ALBUMIN 3.4*   CBC:  Recent Labs Lab 11/04/13 2020 11/05/13 0120 11/05/13 2035 11/06/13 0850  WBC 12.6* 12.4* 13.9* 14.7*  HGB 12.2*  11.7* 11.2* 10.9*  HCT 37.2* 36.4* 36.0* 34.6*  MCV 99.7 102.5* 104.7* 103.6*  PLT 162 180 155 158   Cardiac Enzymes:  Recent Labs Lab 11/04/13 2020  TROPONINI <0.30    Recent Results (from the past 240 hour(s))  MRSA PCR SCREENING     Status: None   Collection Time    11/04/13  4:38 PM      Result Value Range Status   MRSA by PCR NEGATIVE  NEGATIVE Final   Comment:            The GeneXpert MRSA Assay (FDA     approved for NASAL specimens     only), is one component of a     comprehensive MRSA colonization     surveillance program. It is not     intended to diagnose MRSA     infection nor to guide or     monitor treatment for     MRSA infections.     Studies:  Recent x-ray studies have been reviewed in detail by the Attending Physician     Junious Silk, ANP [facility] Office  [phone] Pager [phone]  **If unable to reach the above provider after paging please contact the Flow Manager @ [phone]  On-Call/Text Page:      [email or web address]      password [email or web address]  If 7PM-7AM, please contact night-coverage [email or web address] Password [email or web address] 11/06/2013, 2:38 PM   LOS: 2 days   I have personally examined this patient and reviewed the entire database. I have reviewed the above note, made any necessary editorial changes, and agree with its content.  Lonia Blood, MD [facility]

## 2013-11-07 ENCOUNTER — Inpatient Hospital Stay (HOSPITAL_COMMUNITY): Payer: Medicare Other

## 2013-11-07 DIAGNOSIS — I4891 Unspecified atrial fibrillation: Secondary | ICD-10-CM

## 2013-11-07 DIAGNOSIS — Z86711 Personal history of pulmonary embolism: Secondary | ICD-10-CM

## 2013-11-07 DIAGNOSIS — I10 Essential (primary) hypertension: Secondary | ICD-10-CM

## 2013-11-07 LAB — CBC
Hemoglobin: 10.2 g/dL — ABNORMAL LOW (ref 13.0–17.0)
MCHC: 32.1 g/dL (ref 30.0–36.0)
Platelets: 144 10*3/uL — ABNORMAL LOW (ref 150–400)
Platelets: 145 10*3/uL — ABNORMAL LOW (ref 150–400)
RBC: 3.13 MIL/uL — ABNORMAL LOW (ref 4.22–5.81)
RDW: 13.4 % (ref 11.5–15.5)
WBC: 15.6 10*3/uL — ABNORMAL HIGH (ref 4.0–10.5)
WBC: 14.6 10*3/uL — ABNORMAL HIGH (ref 4.0–10.5)

## 2013-11-07 LAB — BASIC METABOLIC PANEL
BUN: 28 mg/dL — ABNORMAL HIGH (ref 6–23)
CO2: 30 mEq/L (ref 19–32)
Chloride: 105 mEq/L (ref 96–112)
GFR calc Af Amer: 51 mL/min — ABNORMAL LOW (ref >90)
GFR calc non Af Amer: 44 mL/min — ABNORMAL LOW (ref >90)
Glucose, Bld: 103 mg/dL — ABNORMAL HIGH (ref 70–99)
Potassium: 4.7 mEq/L (ref 3.7–5.3)
Sodium: 145 mEq/L (ref 137–147)

## 2013-11-07 MED ORDER — LEVALBUTEROL HCL 1.25 MG/0.5ML IN NEBU
1.2500 mg | INHALATION_SOLUTION | Freq: Once | RESPIRATORY_TRACT | Status: AC
  Administered 2013-11-07: 1.25 mg via RESPIRATORY_TRACT
  Filled 2013-11-07: qty 0.5

## 2013-11-07 MED ORDER — TRAMADOL HCL 50 MG PO TABS
50.0000 mg | ORAL_TABLET | Freq: Four times a day (QID) | ORAL | Status: DC | PRN
  Administered 2013-11-09 – 2013-11-10 (×3): 50 mg via ORAL
  Filled 2013-11-07 (×3): qty 1

## 2013-11-07 MED ORDER — HALOPERIDOL LACTATE 5 MG/ML IJ SOLN
2.5000 mg | Freq: Four times a day (QID) | INTRAMUSCULAR | Status: DC | PRN
  Administered 2013-11-07: 2.5 mg via INTRAVENOUS
  Filled 2013-11-07: qty 1

## 2013-11-07 NOTE — Progress Notes (Signed)
Utilization review completed.  

## 2013-11-07 NOTE — Evaluation (Addendum)
Physical Therapy Evaluation Patient Details Name: Jeff Henson MRN: [id number] DOB: 1945/11/15 Today's Date: 11/07/2013 Time: 4098-1191 PT Time Calculation (min): 25 min  PT Assessment / Plan / Recommendation History of Present Illness  Pt is a 67 y.o. male transferred from [facility] hospital . Pt with h/o AF and PE, presented to [facility] due to chest/back pain. CT revealed retroperitoneal hemorrhage.   Clinical Impression  Pt adm due to the above. Presents with great limitations in functional mobility and independence with mobility secondary to deficits indicated below (see PT problem list). Pt was unable to transfer OOB today secondary to increased pain in Rt ankle; new x-ray order has been put in and we will await WB orders for OOB activities with therapy. Pt to benefit from skilled PT in acute setting to maximize functional mobility and address deficits listed below. Pt would be a great candidate for CIR to increase his independence with ADLs and mobility prior to returning home with family. Pt was independent with all mobility and ADLs prior to hospitalization. Family at bedside and very supportive.     PT Assessment  Patient needs continued PT services    Follow Up Recommendations  Supervision/Assistance - 24 hour;CIR    Does the patient have the potential to tolerate intense rehabilitation      Barriers to Discharge Decreased caregiver support wife works during the day; may be able to have other family members with him    Equipment Recommendations  Other (comment) (TBD)    Recommendations for Other Services OT consult   Frequency Min 3X/week    Precautions / Restrictions Precautions Precautions: Fall Restrictions Weight Bearing Restrictions: No   Pertinent Vitals/Pain C/o pain 10/10 in Rt ankle. Edema noted. RN notified. Increased pain with dorsiflexion.       Mobility  Bed Mobility Bed Mobility: Supine to Sit;Sitting - Scoot to Edge of Bed;Sit to Supine;Scooting to  [facility] Supine to Sit: 1: +2 Total assist;HOB elevated;With rails Supine to Sit: Patient Percentage: 30% Sitting - Scoot to Edge of Bed: 2: Max assist Sit to Supine: 1: +2 Total assist;HOB flat Sit to Supine: Patient Percentage: 20% Scooting to HOB: 1: +2 Total assist Scooting to [facility]: Patient Percentage: 0% Details for Bed Mobility Assistance: pt limited due to generalized weakness and pain in Rt LE; pt required use of draw pad to bring shoulders to sitting position and to bring LEs off EOB; pt able to advance LEs minimally with incr time and max cues Transfers Transfers: Not assessed Ambulation/Gait Ambulation/Gait Assistance: Not tested (comment) Stairs: No Wheelchair Mobility Wheelchair Mobility: No         PT Diagnosis: Difficulty walking;Generalized weakness;Acute pain  PT Problem List: Decreased strength;Decreased activity tolerance;Decreased balance;Decreased mobility;Decreased knowledge of use of DME;Decreased cognition;Decreased safety awareness;Pain;Obesity PT Treatment Interventions: DME instruction;Gait training;Stair training;Functional mobility training;Therapeutic activities;Therapeutic exercise;Balance training;Neuromuscular re-education;Patient/family education     PT Goals(Current goals can be found in the care plan section) Acute Rehab PT Goals Patient Stated Goal: none stated PT Goal Formulation: With patient/family Time For Goal Achievement: 11/21/13 Potential to Achieve Goals: Good  Visit Information  Last PT Received On: 11/07/13 Assistance Needed: +2 History of Present Illness: Pt is a 67 y.o. male transferred from [facility] hospital        Prior Functioning  Home Living Family/patient expects to be discharged to:: Private residence Living Arrangements: Spouse/significant other Available Help at Discharge: Family;Available PRN/intermittently;Friend(s) Type of Home: House Home Access: Stairs to enter [employer] of Steps: 2 Entrance  Stairs-Rails: None  Home Layout: One level Home Equipment: None Additional Comments: pt very independent prior to admission; able to drive and babysit grandkids  Prior Function Level of Independence: Independent Communication Communication: No difficulties Dominant Hand: Right    Cognition  Cognition Arousal/Alertness: Lethargic;Suspect due to medications Behavior During Therapy: Flat affect Overall Cognitive Status: Impaired/Different from baseline Area of Impairment: Orientation;Following commands;Attention Orientation Level: Disoriented to;Place;Situation;Time Current Attention Level: Selective Memory: Decreased short-term memory Following Commands: Follows one step commands with increased time General Comments: pt initially very lethargic; arrousable with multimodal cues    Extremity/Trunk Assessment Upper Extremity Assessment Upper Extremity Assessment: Defer to OT evaluation Lower Extremity Assessment Lower Extremity Assessment: RLE deficits/detail RLE Deficits / Details: limited due to pain in ankle  RLE: Unable to fully assess due to pain RLE Sensation:  (c/o pain with deep pressure and light touch in calf ) Cervical / Trunk Assessment Cervical / Trunk Assessment: Normal   Balance Balance Balance Assessed: Yes Static Sitting Balance Static Sitting - Balance Support: Bilateral upper extremity supported;Feet supported Static Sitting - Level of Assistance: 5: Stand by assistance;3: Mod assist Static Sitting - Comment/# of Minutes: initially pt was requiring incr (A) to maintain upright sitting posture at EOB; pt was thrusting posteriorly thoughout sitting but progressed to stand by (A) with time and max cues; tolerated sitting EOB 8 min; BP 142/68 Dynamic Sitting Balance Dynamic Sitting - Balance Support: During functional activity;Feet supported;Left upper extremity supported;Right upper extremity supported Dynamic Sitting - Level of Assistance: 4: Min assist Dynamic  Sitting - Comments: pt reaching out of BOS; requires min (A) to maintain balance; fatigues quickly   End of Session PT - End of Session Equipment Utilized During Treatment: Oxygen Activity Tolerance: Patient limited by pain;Patient limited by lethargy;Patient limited by fatigue Patient left: in bed;with call bell/phone within reach;with bed alarm set;with family/visitor present Nurse Communication: Mobility status;Precautions  GP     Donell Sievert, [place] [phone] 11/07/2013, 11:49 AM

## 2013-11-07 NOTE — Progress Notes (Signed)
Patient appear very confused and will not stay in bed, MD notified and security had to be called to the floor. 0.5mg  of Ativan IV was given per MD on call's order. Security had to be called back to the floor since patient would not allow the medication through his IV. Patient a bit calmer after Ativan was given, will continue to monitor.

## 2013-11-07 NOTE — Progress Notes (Addendum)
Clinical Social Work Department CLINICAL SOCIAL WORK PLACEMENT NOTE 11/07/2013  Patient:  Jeff Henson, Jeff Henson  Account Number:  [id number] Admit date:  11/04/2013  Clinical Social Worker:  Jetta Lout, Theresia Majors  Date/time:  11/07/2013 05:20 PM  Clinical Social Work is seeking post-discharge placement for this patient at the following level of care:   SKILLED NURSING   (*CSW will update this form in Epic as items are completed)   11/07/2013  Patient/family provided with [facility] Department of Clinical Social Work's list of facilities offering this level of care within the geographic area requested by the patient (or if unable, by the patient's family).  11/07/2013  Patient/family informed of their freedom to choose among providers that offer the needed level of care, that participate in Medicare, Medicaid or managed care program needed by the patient, have an available bed and are willing to accept the patient.  11/07/2013  Patient/family informed of [facility]' ownership interest in [facility], as well as of the fact that they are under no obligation to receive care at this facility.  PASARR submitted to EDS on 11/07/2013 PASARR number received from EDS on 11/07/2013 - [id number]  FL2 transmitted to all facilities in geographic area requested by pt/family on  11/07/2013 FL2 transmitted to all facilities within larger geographic area on   Patient informed that his/her managed care company has contracts with or will negotiate with  certain facilities, including the following:     Patient/family informed of bed offers received:   Patient accepted for admission to Inpatient Rehab at [facility] on 11/10/13  Physician recommends and patient chooses bed at    Patient to be transferred to CIR on 11/10/13   Patient to be transferred to facility by   The following physician request were entered in Epic:   Additional Comments:

## 2013-11-07 NOTE — Progress Notes (Signed)
Bilateral soft wrist restraints active now since patient tried to get out of bed again. Will continue to monitor.

## 2013-11-07 NOTE — Evaluation (Signed)
Occupational Therapy Evaluation Patient Details Name: Jeff Henson MRN: [id number] DOB: 03/04/1946 Today's Date: 11/07/2013 Time: 7829-5621 OT Time Calculation (min): 27 min  OT Assessment / Plan / Recommendation History of present illness Pt is a 67 y.o. male transferred from [facility] hospital . Pt with h/o AF and PE, presented to [facility] due to chest/back pain. CT revealed retroperitoneal hemorrhage.    Clinical Impression   Pt admitted with above.  He presents to OT with the below listed deficits, and will benefit from continued OT to maximize safety and independence with BADLs.   Pt required min A to move to EOB, with increased time, but unable to attempt standing due to c/o severe pain Rt. Ankle - significant edema noted.  Pt is lethargic and that appears to be impacting performance.  Based on today's status, recommend CIR as he was independent PTA.  Wife is a Engineer, civil (consulting) and is very supportive and able to provide necessary level of care at discharge     OT Assessment  Patient needs continued OT Services    Follow Up Recommendations  CIR;Supervision/Assistance - 24 hour    Barriers to Discharge      Equipment Recommendations  3 in 1 bedside comode    Recommendations for Other Services    Frequency  Min 2X/week    Precautions / Restrictions Precautions Precautions: Fall Restrictions Weight Bearing Restrictions: No   Pertinent Vitals/Pain     ADL  Eating/Feeding: Supervision/safety;Set up Where Assessed - Eating/Feeding: Bed level Grooming: Wash/dry hands;Wash/dry face;Teeth care;Minimal assistance Where Assessed - Grooming: Unsupported sitting Upper Body Bathing: Moderate assistance Where Assessed - Upper Body Bathing: Unsupported sitting Lower Body Bathing: Maximal assistance Where Assessed - Lower Body Bathing: Supine, head of bed up;Unsupported sitting;Lean right and/or left;Rolling right and/or left Upper Body Dressing: Moderate assistance Where Assessed - Upper Body  Dressing: Unsupported sitting Lower Body Dressing: +1 Total assistance Where Assessed - Lower Body Dressing: Supine, head of bed up;Rolling right and/or left;Unsupported sitting Toilet Transfer: +1 Total assistance (unable due to Rt. ankle pain) Toileting - Clothing Manipulation and Hygiene: +1 Total assistance Where Assessed - Toileting Clothing Manipulation and Hygiene: Supine, head of bed flat;Sit on 3-in-1 or toilet Transfers/Ambulation Related to ADLs: Pt unable to move sit to stand due to increased pain Rt ankle.  Pt.was able to scoot up EOB bed toward [facility] with min A ADL Comments: Pt attempted to don socks, but unable to access feet.  Pt very lethargic which limits his abillity to perform BADLs    OT Diagnosis: Generalized weakness;Acute pain;Cognitive deficits  OT Problem List: Decreased strength;Decreased activity tolerance;Impaired balance (sitting and/or standing);Decreased safety awareness;Decreased knowledge of use of DME or AE;Obesity;Pain;Increased edema OT Treatment Interventions: Self-care/ADL training;DME and/or AE instruction;Therapeutic activities;Cognitive remediation/compensation;Patient/family education;Balance training   OT Goals(Current goals can be found in the care plan section) Acute Rehab OT Goals Patient Stated Goal: to get better OT Goal Formulation: With patient/family Time For Goal Achievement: 11/21/13 Potential to Achieve Goals: Good ADL Goals Pt Will Perform Grooming: with min guard assist;standing Pt Will Perform Upper Body Bathing: with set-up;sitting Pt Will Perform Lower Body Bathing: with min guard assist;sit to/from stand Pt Will Perform Upper Body Dressing: with set-up;sitting Pt Will Perform Lower Body Dressing: with min guard assist;sit to/from stand Pt Will Transfer to Toilet: with min guard assist;ambulating;regular height toilet;bedside commode;grab bars Pt Will Perform Toileting - Clothing Manipulation and hygiene: with min guard assist;sit  to/from stand  Visit Information  Last OT Received On: 11/07/13 Assistance Needed: +  2 History of Present Illness: Pt is a 67 y.o. male transferred from [facility] hospital . Pt with h/o AF and PE, presented to [facility] due to chest/back pain. CT revealed retroperitoneal hemorrhage.        Prior Functioning     Home Living Family/patient expects to be discharged to:: Private residence Living Arrangements: Spouse/significant other Available Help at Discharge: Family;Available PRN/intermittently;Friend(s) Type of Home: House Home Access: Stairs to enter [employer] of Steps: 2 Entrance Stairs-Rails: None Home Layout: One level Home Equipment: None Additional Comments: pt very independent prior to admission; able to drive and babysit grandkids.  Wife is a Engineer, civil (consulting) who works part time Prior Function Level of Independence: Independent Communication Communication: No difficulties Dominant Hand: Right         Vision/Perception     Cognition  Cognition Arousal/Alertness: Lethargic;Suspect due to medications Behavior During Therapy: Flat affect Overall Cognitive Status: Impaired/Different from baseline Area of Impairment: Following commands;Attention;Problem solving Orientation Level: Disoriented to;Place;Situation;Time Current Attention Level: Sustained Memory: Decreased short-term memory Following Commands: Follows one step commands with increased time Problem Solving: Slow processing General Comments: pt initially very lethargic; arrousable with multimodal cues    Extremity/Trunk Assessment Upper Extremity Assessment Upper Extremity Assessment: Overall WFL for tasks assessed Lower Extremity Assessment Lower Extremity Assessment: Defer to PT evaluation RLE Deficits / Details: limited due to pain in ankle  RLE: Unable to fully assess due to pain RLE Sensation:  (c/o pain with deep pressure and light touch in calf ) Cervical / Trunk Assessment Cervical / Trunk  Assessment: Normal     Mobility Bed Mobility Bed Mobility: Supine to Sit;Sitting - Scoot to [place] of Bed;Sit to Sidelying Right;Scooting to HOB Supine to Sit: 4: Min assist;With rails;HOB elevated Supine to Sit: Patient Percentage: 30% Sitting - Scoot to Edge of Bed: 4: Min assist Sit to Supine: 1: +2 Total assist;HOB flat Sit to Supine: Patient Percentage: 20% Sit to Sidelying Right: 4: Min assist;HOB flat Scooting to HOB: 1: +2 Total assist Scooting to [facility]: Patient Percentage: 60% Details for Bed Mobility Assistance: Pt requires step by step instruction and increased time to complete all activity.  Lethargy appears to limiting performance significantly  Transfers Transfers: Not assessed (due to pain )     Exercise     Balance Balance Balance Assessed: Yes Static Sitting Balance Static Sitting - Balance Support: Feet supported;No upper extremity supported Static Sitting - Level of Assistance: 5: Stand by assistance;4: Min assist Static Sitting - Comment/# of Minutes: Initially required min A, but progressed to Supervision Dynamic Sitting Balance Dynamic Sitting - Balance Support: Feet supported;No upper extremity supported Dynamic Sitting - Level of Assistance: Other (comment) (min guard assist) Dynamic Sitting - Comments: when attempting to don socks   End of Session OT - End of Session Activity Tolerance: Patient limited by lethargy Patient left: in bed;with call bell/phone within reach;with family/visitor present;with bed alarm set  GO     Khamiyah Grefe, Ursula Alert M 11/07/2013, 2:02 PM

## 2013-11-07 NOTE — Progress Notes (Signed)
Patient agitated and confused again, and would not stay in bed. MD on call notified, 2.5mg  of haldol ordered. Patient asleep after haldol was given. Will continue to monitor.

## 2013-11-07 NOTE — Progress Notes (Signed)
[facility]                                                                                Patient Demographics  Jeff Henson, is a 67 y.o. male, DOB - 1945/11/23, ZOX:[id number]  Admit date - 11/04/2013   Admitting Physician Lonia Farber, MD  Outpatient Primary MD for the patient is No PCP Per Patient  LOS - 3   No chief complaint on file.       Assessment & Plan  Active Problems:   Retroperitoneal bleeding   Acute blood loss anemia   Coagulopathy   History of pulmonary embolism   DVT (deep venous thrombosis)   Atrial fibrillation   Chronic diastolic heart failure   Left ventricular dilatation   HTN (hypertension)   CKD (chronic kidney disease) stage 3, GFR 30-59 ml/min  Retroperitoneal bleeding  -Secondary to anticoagulation -Symptomatic treatment since this is self-limiting process  -hold anti coagulation for at least 3-4 weeks (risk outweighs benefit)  -Hemoglobin currently stable 11.4/35.2  Acute blood loss anemia - due to above  -H/H stable 11.4 -Will continue to monitor  Coagulopathy  -due to Eliquis - now corrected   Atrial fibrillation  -maintaining NSR on Tikosyn and Metoprolol  -not a candidate for anti coagulation for a minimum of 2-4 weeks   R foot pain  -very tender over dorsum  -given hemolysis associated with RPH high risk for gout  -check uric acid and follow clinically  -Will obtain xray of the right foot  Chronic diastolic heart failure/ Left ventricular dilatation  -compensated   History of pulmonary embolism/DVT (deep venous thrombosis)  -PCCM performed LE duplex- since no acute DVT no indication for IVC filter at this time   Hypertension  -Controlled, continue metoprolol  CKD stage 3, GFR 30-59 ml/min  -Scr climbing - will need to follow trend   Hyperkalemia  -Resolved, likely due to blood break down in large retroperitoneal hemorrhage -Will continue to monitor  Code Status: Full  Family Communication:  Wife and daughter at bedside  Disposition Plan: Admitted, will likely discharge next few days.   Procedures  Bilateral lower extremity venous Doppler- negative for DVT  Consults   PCCM  DVT Prophylaxis SCDs   Lab Results  Component Value Date   PLT 144* 11/07/2013    Medications  Scheduled Meds: . antiseptic oral rinse  15 mL Mouth Rinse BID  . dofetilide  250 mcg Oral BID  . metoprolol  200 mg Oral Daily   Continuous Infusions:  PRN Meds:.haloperidol lactate, HYDROmorphone (DILAUDID) injection, ondansetron (ZOFRAN) IV, traMADol  Antibiotics    Anti-infectives   None       Time Spent in minutes   30 minutes   Jeff Henson D.O. on 11/07/2013 at 2:53 PM  Between 7am to 7pm - Pager - [phone]  After 7pm go to [email or web address] - password [email or web address]  And look for the night coverage person covering for me after hours  [facility] Office  [phone]    Subjective:   Jeff Henson seen and examined today.   Patient currently complains of right foot pain. Otherwise has no complaints.  Objective:   Filed Vitals:  11/07/13 0629 11/07/13 0900 11/07/13 1300 11/07/13 1426  BP:  132/56 152/67 150/65  Pulse:  86 90 85  Temp:  98.8 F (37.1 C) 99.1 F (37.3 C) 99 F (37.2 C)  TempSrc:  Oral Oral Oral  Resp:  20 20 18   Height:      Weight:      SpO2: 95% 96% 96% 97%    Wt Readings from Last 3 Encounters:  11/07/13 125 kg (275 lb 9.2 oz)     Intake/Output Summary (Last 24 hours) at 11/07/13 1453 Last data filed at 11/07/13 1300  Gross per 24 hour  Intake    120 ml  Output    550 ml  Net   -430 ml    Exam  General: Well developed, well nourished, NAD, appears stated age  HEENT: NCAT, PERRLA, EOMI, Anicteic Sclera, mucous membranes moist. No pharyngeal erythema or exudates  Neck: Supple, no JVD, no masses  Cardiovascular: S1 S2 auscultated, no rubs, murmurs or gallops. Regular rate and rhythm.  Respiratory: Clear to  auscultation bilaterally with equal chest rise  Abdomen: Soft, nontender, nondistended, + bowel sounds  Extremities: warm dry without cyanosis clubbing.  Mild edema noted on the right foot.  Neuro: AAOx3, cranial nerves grossly intact.   Skin: Without rashes exudates or nodules  Psych: Normal affect and demeanor with intact judgement and insight  Data Review   Micro Results Recent Results (from the past 240 hour(s))  MRSA PCR SCREENING     Status: None   Collection Time    11/04/13  4:38 PM      Result Value Range Status   MRSA by PCR NEGATIVE  NEGATIVE Final   Comment:            The GeneXpert MRSA Assay (FDA     approved for NASAL specimens     only), is one component of a     comprehensive MRSA colonization     surveillance program. It is not     intended to diagnose MRSA     infection nor to guide or     monitor treatment for     MRSA infections.    Radiology Reports Dg Foot Complete Right  11/07/2013   CLINICAL DATA:  Right foot pain and swelling  EXAM: RIGHT FOOT COMPLETE - 3+ VIEW  COMPARISON:  None.  FINDINGS: There is generalized osteopenia. There is no fracture or dislocation. There are mild degenerative changes of the 1st MTP joint. There is mild soft tissue swelling over the dorsal aspect of the foot. Marland Kitchen  IMPRESSION: No acute osseous injury of the right foot.   Electronically Signed   By: Elige Ko   On: 11/07/2013 12:26    CBC  Recent Labs Lab 11/05/13 0120 11/05/13 2035 11/06/13 0850 11/06/13 1935 11/07/13 0806  WBC 12.4* 13.9* 14.7* 16.2* 15.6*  HGB 11.7* 11.2* 10.9* 11.1* 11.4*  HCT 36.4* 36.0* 34.6* 34.3* 35.2*  PLT 180 155 158 150 144*  MCV 102.5* 104.7* 103.6* 102.1* 101.7*  MCH 33.0 32.6 32.6 33.0 32.9  MCHC 32.1 31.1 31.5 32.4 32.4  RDW 14.7 14.5 14.1 13.6 13.4    Chemistries   Recent Labs Lab 11/04/13 1700 11/05/13 0120 11/07/13 0806  NA 142 141 145  K 5.7* 5.7* 4.7  CL 110 109 105  CO2 23 26 30   GLUCOSE 127* 135* 103*  BUN  19 20 28*  CREATININE 1.44* 1.53* 1.54*  CALCIUM 8.0* 7.9* 9.0  MG 1.8  --   --  AST 20  --   --   ALT 15  --   --   ALKPHOS 47  --   --   BILITOT 0.5  --   --    ------------------------------------------------------------------------------------------------------------------ estimated creatinine clearance is 60.1 ml/min (by C-G formula based on Cr of 1.54). ------------------------------------------------------------------------------------------------------------------ No results found for this basename: HGBA1C,  in the last 72 hours ------------------------------------------------------------------------------------------------------------------ No results found for this basename: CHOL, HDL, LDLCALC, TRIG, CHOLHDL, LDLDIRECT,  in the last 72 hours ------------------------------------------------------------------------------------------------------------------ No results found for this basename: TSH, T4TOTAL, FREET3, T3FREE, THYROIDAB,  in the last 72 hours ------------------------------------------------------------------------------------------------------------------ No results found for this basename: VITAMINB12, FOLATE, FERRITIN, TIBC, IRON, RETICCTPCT,  in the last 72 hours  Coagulation profile  Recent Labs Lab 11/04/13 1700  INR 1.06    No results found for this basename: DDIMER,  in the last 72 hours  Cardiac Enzymes  Recent Labs Lab 11/04/13 2020  TROPONINI <0.30   ------------------------------------------------------------------------------------------------------------------ No components found with this basename: POCBNP,

## 2013-11-07 NOTE — Progress Notes (Signed)
Rehab Admissions Coordinator Note:  Patient was screened by Jeff Henson for appropriateness for an Inpatient Acute Rehab Consult.  At this time, we are recommending Inpatient Rehab consult.  Jeff Henson 11/07/2013, 4:28 PM  I can be reached at [phone].

## 2013-11-07 NOTE — Progress Notes (Signed)
Clinical Social Work Department BRIEF PSYCHOSOCIAL ASSESSMENT 11/07/2013  Patient:  MICKEL, SCHREUR     Account Number:  [id number]     Admit date:  11/04/2013  Clinical Social Worker:  Hendricks Milo  Date/Time:  11/07/2013 05:15 PM  Referred by:  Physician  Date Referred:  11/07/2013 Referred for  SNF Placement   Other Referral:   Interview type:  Family Other interview type:    PSYCHOSOCIAL DATA Living Status:  WIFE Admitted from facility:   Level of care:   Primary support name:  Mary Lando Primary support relationship to patient:  SPOUSE Degree of support available:   Very supportive at bedside during assessment.    CURRENT CONCERNS  Other Concerns:    SOCIAL WORK ASSESSMENT / PLAN Clinical Social Worker (CSW) met with patient's wife Corrie Dandy and daughter Vernona Rieger to discuss D/C plans. Patient was not alert and oriented. Wife and daughter agreeable to SNF search in [facility] as a back up to CIR. Wife reported that her first choice of SNF in [place] is [facility].   Assessment/plan status:  Psychosocial Support/Ongoing Assessment of Needs Other assessment/ plan:   Information/referral to community resources:   CSW gave wife SNF list.    PATIENT'S/FAMILY'S RESPONSE TO PLAN OF CARE: Wife and daughter thanked CSW for visit and making a back up plan to CIR.

## 2013-11-08 ENCOUNTER — Inpatient Hospital Stay (HOSPITAL_COMMUNITY): Payer: Medicare Other

## 2013-11-08 DIAGNOSIS — R58 Hemorrhage, not elsewhere classified: Secondary | ICD-10-CM

## 2013-11-08 DIAGNOSIS — I82409 Acute embolism and thrombosis of unspecified deep veins of unspecified lower extremity: Secondary | ICD-10-CM

## 2013-11-08 LAB — CBC
HCT: 32 % — ABNORMAL LOW (ref 39.0–52.0)
HCT: 33.1 % — ABNORMAL LOW (ref 39.0–52.0)
HCT: 33.3 % — ABNORMAL LOW (ref 39.0–52.0)
Hemoglobin: 10.3 g/dL — ABNORMAL LOW (ref 13.0–17.0)
Hemoglobin: 10.4 g/dL — ABNORMAL LOW (ref 13.0–17.0)
Hemoglobin: 10.8 g/dL — ABNORMAL LOW (ref 13.0–17.0)
MCH: 32.9 pg (ref 26.0–34.0)
MCH: 32.1 pg (ref 26.0–34.0)
MCHC: 31.4 g/dL (ref 30.0–36.0)
MCV: 102.2 fL — ABNORMAL HIGH (ref 78.0–100.0)
MCV: 102.2 fL — ABNORMAL HIGH (ref 78.0–100.0)
MCV: 101.8 fL — ABNORMAL HIGH (ref 78.0–100.0)
Platelets: 166 10*3/uL (ref 150–400)
Platelets: 169 10*3/uL (ref 150–400)
RBC: 3.13 MIL/uL — ABNORMAL LOW (ref 4.22–5.81)
RBC: 3.27 MIL/uL — ABNORMAL LOW (ref 4.22–5.81)
RDW: 13.3 % (ref 11.5–15.5)
WBC: 14.2 10*3/uL — ABNORMAL HIGH (ref 4.0–10.5)
WBC: 11.2 10*3/uL — ABNORMAL HIGH (ref 4.0–10.5)

## 2013-11-08 LAB — BASIC METABOLIC PANEL
BUN: 32 mg/dL — ABNORMAL HIGH (ref 6–23)
CO2: 30 mEq/L (ref 19–32)
Chloride: 107 mEq/L (ref 96–112)
Creatinine, Ser: 1.46 mg/dL — ABNORMAL HIGH (ref 0.50–1.35)
GFR calc non Af Amer: 47 mL/min — ABNORMAL LOW (ref >90)
Potassium: 5 mEq/L (ref 3.7–5.3)

## 2013-11-08 NOTE — Progress Notes (Signed)
Physical Therapy Treatment Patient Details Name: Jeff Henson MRN: [id number] DOB: [birth year] Today's Date: 11/08/2013 Time: 7829-5621 PT Time Calculation (min): 26 min  PT Assessment / Plan / Recommendation  History of Present Illness Pt is a 67 y.o. male transferred from [facility] hospital . Pt with h/o AF and PE, presented to [facility] due to chest/back pain. CT revealed retroperitoneal hemorrhage.    PT Comments   Patient is still limited by pain in functional mobility.  Also question if problem solving deficits are factoring in as well (patient appeared to have difficulty problem solving sit to stand transfer).  Do feel patient would benefit from CIR to achieve maximum potential for discharge home with supportive family.  Will continue to benefit from PT for progressive mobility and exercises.   Follow Up Recommendations  CIR     Does the patient have the potential to tolerate intense rehabilitation     Barriers to Discharge        Equipment Recommendations  Rolling walker with 5" wheels    Recommendations for Other Services    Frequency Min 3X/week   Progress towards PT Goals Progress towards PT goals: Progressing toward goals  Plan Current plan remains appropriate    Precautions / Restrictions Precautions Precautions: Fall Restrictions Weight Bearing Restrictions: No   Pertinent Vitals/Pain Patient reports pain in both ankle/feet at rest and with movement.  Unable to weight bear on feet to attempt sit to stand.  Unable to rate pain level.      Mobility  Bed Mobility Bed Mobility: Supine to Sit;Sitting - Scoot to [place] of Bed;Sit to Sidelying Right;Scooting to HOB Supine to Sit: 4: Min assist;With rails;HOB elevated Sitting - Scoot to Edge of Bed: 4: Min assist Sit to Sidelying Right: 4: Min assist;HOB flat Scooting to HOB: 1: +2 Total assist Scooting to [facility]: Patient Percentage: 70% Details for Bed Mobility Assistance: patient required assistance for LE's secondary to  pain and generalized weakness Transfers Details for Transfer Assistance: attempted sit to stand transfer, unable to shift weight forward onto LE's to attempt standing.  Unsure if this is due to pain, or difficulty problem solving sit to stand.  Patient did report pain in BLE's with attempted transfer.    Exercises General Exercises - Lower Extremity Ankle Circles/Pumps: AROM;Both;5 reps;Seated;Limitations Ankle Circles/Pumps Limitations: diffuculty due to pain Quad Sets: AROM;Both;10 reps;Supine Gluteal Sets: AROM;Both;Seated Long Arc Quad: AAROM;Both;10 reps;Seated   PT Diagnosis:    PT Problem List:   PT Treatment Interventions:     PT Goals (current goals can now be found in the care plan section)    Visit Information  Last PT Received On: 11/08/13 Assistance Needed: +2 History of Present Illness: Pt is a 67 y.o. male transferred from [facility] hospital . Pt with h/o AF and PE, presented to [facility] due to chest/back pain. CT revealed retroperitoneal hemorrhage.     Subjective Data      Cognition  Cognition Arousal/Alertness: Awake/alert Behavior During Therapy: Flat affect Overall Cognitive Status: Impaired/Different from baseline Area of Impairment: Following commands;Problem solving Following Commands: Follows one step commands with increased time Problem Solving: Slow processing    Balance  Static Sitting Balance Static Sitting - Balance Support: Feet supported;No upper extremity supported Static Sitting - Level of Assistance: 6: Modified independent (Device/Increase time) Static Sitting - Comment/# of Minutes: much more stable sitting EOB today  End of Session PT - End of Session Equipment Utilized During Treatment: Gait belt;Oxygen Activity Tolerance: Patient limited by pain Patient left:  in bed;with call bell/phone within reach;with family/visitor present   GP     Olivia Canter, [place] [phone] 11/08/2013, 10:33 AM

## 2013-11-08 NOTE — Consult Note (Signed)
Physical Medicine and Rehabilitation Consult Reason for Consult: Retroperitoneal bleed/deconditioning Referring Physician: [facility]   HPI: Jeff Henson is a 67 y.o. right-handed male with history of atrial fibrillation and pulmonary emboli maintained on Eliquis, chronic renal insufficiency with baseline creatinine 1.4. Patient independent in driving prior to admission. Admitted to [facility] 11/04/2013 with chest and back pain. CTA chest revealed retroperitoneal hemorrhage secondary to anticoagulation. Anti-coagulation held anticipate 3-4 weeks. Lower extremity Doppler showed no signs of DVT. Atrial fibrillation maintained on Tikosyn as well as Toprol. Complaints of right foot pain x-ray showed no acute osseous injury there was some mild soft tissue swelling over the dorsal aspect of the foot. Uric acid level mildly elevated at 8.1 await plan of care by internal medicine for suspect gout. Physical and occupational therapy evaluation completed 11/07/2013 with recommendations for physical medicine rehabilitation consult to consider inpatient rehabilitation services.  C/o severe pain BLE in feet as well as R>L knee  Review of Systems  Respiratory: Positive for shortness of breath.   Cardiovascular: Positive for leg swelling.  Gastrointestinal:       GERD  Musculoskeletal: Positive for myalgias.  Neurological: Positive for headaches.  All other systems reviewed and are negative.   Past Medical History  Diagnosis Date  . Hypertension   . Dysrhythmia   . Shortness of breath   . GERD (gastroesophageal reflux disease)   . Chronic kidney disease   . Headache(784.0)   . Cancer april 2013    prostate, radiation & chemo  . Arthritis     hips and shoulders  . Acute blood loss anemia 11/04/2013   Past Surgical History  Procedure Laterality Date  . Cardiac catheterization  2007    ablation  . Cardiac catheterization  2013    for chest pain, no CAD noted  .  Tonsillectomy      as child   No family history on file. Social History:  reports that he quit smoking about 30 years ago. His smoking use included Pipe and Cigars. He does not have any smokeless tobacco history on file. He reports that he does not drink alcohol or use illicit drugs. Allergies:  Allergies  Allergen Reactions  . Talwin [Pentazocine] Shortness Of Breath  . Levaquin [Levofloxacin]     Muscle aches  . Rivaroxaban Rash   Medications Prior to Admission  Medication Sig Dispense Refill  . acetaminophen (TYLENOL) 500 MG tablet Take 1,000 mg by mouth every 6 (six) hours as needed for mild pain.      Marland Kitchen apixaban (ELIQUIS) 5 MG TABS tablet Take 5 mg by mouth 2 (two) times daily.      Marland Kitchen dofetilide (TIKOSYN) 250 MCG capsule Take 250 mcg by mouth 2 (two) times daily.      . hydrALAZINE (APRESOLINE) 25 MG tablet Take 25 mg by mouth 3 (three) times daily.      Marland Kitchen lisinopril (PRINIVIL,ZESTRIL) 20 MG tablet Take 20 mg by mouth daily.      . metoprolol (TOPROL-XL) 200 MG 24 hr tablet Take 200 mg by mouth daily.        Home: Home Living Family/patient expects to be discharged to:: Private residence Living Arrangements: Spouse/significant other Available Help at Discharge: Family;Available PRN/intermittently;Friend(s) Type of Home: House Home Access: Stairs to enter [employer] of Steps: 2 Entrance Stairs-Rails: None Home Layout: One level Home Equipment: None Additional Comments: pt very independent prior to admission; able to drive and babysit grandkids.  Wife is a Engineer, civil (consulting) who works part  time  Functional History:   Functional Status:  Mobility: Bed Mobility Bed Mobility: Supine to Sit;Sitting - Scoot to [place] of Bed;Sit to Sidelying Right;Scooting to HOB Supine to Sit: 4: Min assist;With rails;HOB elevated Supine to Sit: Patient Percentage: 30% Sitting - Scoot to Edge of Bed: 4: Min assist Sit to Supine: 1: +2 Total assist;HOB flat Sit to Supine: Patient Percentage:  20% Sit to Sidelying Right: 4: Min assist;HOB flat Scooting to HOB: 1: +2 Total assist Scooting to [facility]: Patient Percentage: 60% Transfers Transfers: Not assessed Ambulation/Gait Ambulation/Gait Assistance: Not tested (comment) Stairs: No Wheelchair Mobility Wheelchair Mobility: No  ADL: ADL Eating/Feeding: Supervision/safety;Set up Where Assessed - Eating/Feeding: Bed level Grooming: Wash/dry hands;Wash/dry face;Teeth care;Minimal assistance Where Assessed - Grooming: Unsupported sitting Upper Body Bathing: Moderate assistance Where Assessed - Upper Body Bathing: Unsupported sitting Lower Body Bathing: Maximal assistance Where Assessed - Lower Body Bathing: Supine, head of bed up;Unsupported sitting;Lean right and/or left;Rolling right and/or left Upper Body Dressing: Moderate assistance Where Assessed - Upper Body Dressing: Unsupported sitting Lower Body Dressing: +1 Total assistance Where Assessed - Lower Body Dressing: Supine, head of bed up;Rolling right and/or left;Unsupported sitting Toilet Transfer: +1 Total assistance (unable due to Rt. ankle pain) Transfers/Ambulation Related to ADLs: Pt unable to move sit to stand due to increased pain Rt ankle.  Pt.was able to scoot up EOB bed toward [facility] with min A ADL Comments: Pt attempted to don socks, but unable to access feet.  Pt very lethargic which limits his abillity to perform BADLs  Cognition: Cognition Overall Cognitive Status: Impaired/Different from baseline Orientation Level: Oriented to person;Oriented to place;Disoriented to time Cognition Arousal/Alertness: Lethargic;Suspect due to medications Behavior During Therapy: Flat affect Overall Cognitive Status: Impaired/Different from baseline Area of Impairment: Following commands;Attention;Problem solving Orientation Level: Disoriented to;Place;Situation;Time Current Attention Level: Sustained Memory: Decreased short-term memory Following Commands: Follows one step  commands with increased time Problem Solving: Slow processing General Comments: pt initially very lethargic; arrousable with multimodal cues  Blood pressure 147/62, pulse 78, temperature 97.3 F (36.3 C), temperature source Oral, resp. rate 20, height 5\' 10"  (1.778 m), weight 125 kg (275 lb 9.2 oz), SpO2 96.00%. Physical Exam  Vitals reviewed. Constitutional: He is oriented to person, place, and time.  HENT:  Head: Normocephalic.  Eyes: EOM are normal.  Neck: Normal range of motion. Neck supple. No thyromegaly present.  Cardiovascular:  Cardiac rate controlled  Respiratory: Effort normal and breath sounds normal. No respiratory distress.  GI: Soft. Bowel sounds are normal. He exhibits no distension. There is no tenderness.  Musculoskeletal:  Right foot and dorsal tenderness with palpable pedal pulses Swelling R>L foot and ankle, tenderness with bilateral great toe ROM   Mild effusion R knee pain with AROM/PROM Mild pain but no swelling L knee  Neurological: He is alert and oriented to person, place, and time. No sensory deficit.  Strength is 5/5 in bilateral deltoid, bicep, tricep, grip 3 minus/5 bilateral knee extensor, 2 minus in the ankle dorsiflexor and plantar flexor, 2 minus in the hip flexor all limited by pain  Skin: Skin is warm and dry.    Results for orders placed during the hospital encounter of 11/04/13 (from the past 24 hour(s))  BASIC METABOLIC PANEL     Status: Abnormal   Collection Time    11/07/13  8:06 AM      Result Value Range   Sodium 145  137 - 147 mEq/L   Potassium 4.7  3.7 - 5.3 mEq/L  Chloride 105  96 - 112 mEq/L   CO2 30  19 - 32 mEq/L   Glucose, Bld 103 (*) 70 - 99 mg/dL   BUN 28 (*) 6 - 23 mg/dL   Creatinine, Ser 1.61 (*) 0.50 - 1.35 mg/dL   Calcium 9.0  8.4 - 09.6 mg/dL   GFR calc non Af Amer 44 (*) >90 mL/min   GFR calc Af Amer 51 (*) >90 mL/min  CBC     Status: Abnormal   Collection Time    11/07/13  8:06 AM      Result Value Range    WBC 15.6 (*) 4.0 - 10.5 K/uL   RBC 3.46 (*) 4.22 - 5.81 MIL/uL   Hemoglobin 11.4 (*) 13.0 - 17.0 g/dL   HCT 04.5 (*) 40.9 - 81.1 %   MCV 101.7 (*) 78.0 - 100.0 fL   MCH 32.9  26.0 - 34.0 pg   MCHC 32.4  30.0 - 36.0 g/dL   RDW 91.4  78.2 - 95.6 %   Platelets 144 (*) 150 - 400 K/uL  CBC     Status: Abnormal   Collection Time    11/07/13  8:26 PM      Result Value Range   WBC 14.6 (*) 4.0 - 10.5 K/uL   RBC 3.13 (*) 4.22 - 5.81 MIL/uL   Hemoglobin 10.2 (*) 13.0 - 17.0 g/dL   HCT 21.3 (*) 08.6 - 57.8 %   MCV 101.6 (*) 78.0 - 100.0 fL   MCH 32.6  26.0 - 34.0 pg   MCHC 32.1  30.0 - 36.0 g/dL   RDW 46.9  62.9 - 52.8 %   Platelets 145 (*) 150 - 400 K/uL  CBC     Status: Abnormal   Collection Time    11/08/13  5:45 AM      Result Value Range   WBC 14.2 (*) 4.0 - 10.5 K/uL   RBC 3.13 (*) 4.22 - 5.81 MIL/uL   Hemoglobin 10.3 (*) 13.0 - 17.0 g/dL   HCT 41.3 (*) 24.4 - 01.0 %   MCV 102.2 (*) 78.0 - 100.0 fL   MCH 32.9  26.0 - 34.0 pg   MCHC 32.2  30.0 - 36.0 g/dL   RDW 27.2  53.6 - 64.4 %   Platelets 166  150 - 400 K/uL  BASIC METABOLIC PANEL     Status: Abnormal   Collection Time    11/08/13  5:45 AM      Result Value Range   Sodium 147  137 - 147 mEq/L   Potassium 5.0  3.7 - 5.3 mEq/L   Chloride 107  96 - 112 mEq/L   CO2 30  19 - 32 mEq/L   Glucose, Bld 115 (*) 70 - 99 mg/dL   BUN 32 (*) 6 - 23 mg/dL   Creatinine, Ser 0.34 (*) 0.50 - 1.35 mg/dL   Calcium 8.5  8.4 - 74.2 mg/dL   GFR calc non Af Amer 47 (*) >90 mL/min   GFR calc Af Amer 55 (*) >90 mL/min   Dg Foot Complete Right  11/07/2013   CLINICAL DATA:  Right foot pain and swelling  EXAM: RIGHT FOOT COMPLETE - 3+ VIEW  COMPARISON:  None.  FINDINGS: There is generalized osteopenia. There is no fracture or dislocation. There are mild degenerative changes of the 1st MTP joint. There is mild soft tissue swelling over the dorsal aspect of the foot. Marland Kitchen  IMPRESSION: No acute osseous injury of the right foot.  Electronically Signed    By: Elige Ko   On: 11/07/2013 12:26    Assessment/Plan: Diagnosis: Deconditioning after retroperitoneal hematoma 1. Does the need for close, 24 hr/day medical supervision in concert with the patient's rehab needs make it unreasonable for this patient to be served in a less intensive setting? Potentially 2. 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 Due to bowel management, safety, skin/wound care, disease management, medication administration, pain management and patient education, does the patient require 24 hr/day rehab nursing? Potentially 4. Does the patient require coordinated care of a physician, rehab nurse, PT (1-2 hrs/day, 5 days/week), OT (1-2 hrs/day, 5 days/week) and SLP (0.5-1 hrs/day, 5 days/week) to address physical and functional deficits in the context of the above medical diagnosis(es)? Potentially Addressing deficits in the following areas: balance, endurance, locomotion, strength, transferring, bathing, dressing, toileting and cognition 5. Can the patient actively participate in an intensive therapy program of at least 3 hrs of therapy per day at least 5 days per week? No 6. The potential for patient to make measurable gains while on inpatient rehab is Potentially good 7. Anticipated functional outcomes upon discharge from inpatient rehab are NA with PT, NA with OT, NA with SLP. 8. Estimated rehab length of stay to reach the above functional goals is: NA 9. Does the patient have adequate social supports to accommodate these discharge functional goals? Yes 10. Anticipated D/C setting: Home 11. Anticipated post D/C treatments: HH therapy 12. Overall Rehab/Functional Prognosis: good  RECOMMENDATIONS: This patient's condition is appropriate for continued rehabilitative care in the following setting: May be a good candidate for CIR once lower extremity pain is controlled Patient has agreed to participate  in recommended program. Potentially Note that insurance prior authorization may be required for reimbursement for recommended care.  Comment: may benefit from oral corticosteroid burst and taper    11/08/2013

## 2013-11-08 NOTE — Progress Notes (Signed)
Clinical Child psychotherapist (CSW) contacted [employer] [facility] (skilled nursing facility) and spoke with [employer] the admissions Nurse, adult. Crystal reported that they can extend a bed offer to the patient. CSW gave patient's daughter Vernona Rieger bed offers for the skilled nursing facilities in [place]. Vernona Rieger reported that CIR is their first choice for rehab. CSW will continue to follow for D/C needs.   Jetta Lout, LCSWA Weekend CSW [phone]

## 2013-11-08 NOTE — Progress Notes (Signed)
[facility]                                                                                Patient Demographics  Jeff Henson, is a 67 y.o. male, DOB - May 07, 1946, HYQ:[id number]  Admit date - 11/04/2013   Admitting Physician Lonia Farber, MD  Outpatient Primary MD for the patient is No PCP Per Patient  LOS - 4   No chief complaint on file.       Assessment & Plan  Active Problems:   Retroperitoneal bleeding   Acute blood loss anemia   Coagulopathy   History of pulmonary embolism   DVT (deep venous thrombosis)   Atrial fibrillation   Chronic diastolic heart failure   Left ventricular dilatation   HTN (hypertension)   CKD (chronic kidney disease) stage 3, GFR 30-59 ml/min  Retroperitoneal bleeding  -Secondary to anticoagulation -Symptomatic treatment since this is self-limiting process  -hold anti coagulation for at least 3-4 weeks (risk outweighs benefit)  -Hemoglobin currently stable 10.4/33.1  Acute blood loss anemia - due to above  -H/H stable 10.4 -Will continue to monitor  Coagulopathy  -due to Eliquis - now corrected   Atrial fibrillation  -maintaining NSR on Tikosyn and Metoprolol  -not a candidate for anti coagulation for a minimum of 2-4 weeks   R foot pain  -very tender over dorsum  -given hemolysis associated with retroperitoneal bleed high risk for gout  -Foot Xray: No acute osseous injury of the right foot. Mild degenerative changes of 1st MTP joint  Chronic diastolic heart failure/ Left ventricular dilatation  -compensated   History of pulmonary embolism/DVT (deep venous thrombosis)  -PCCM performed LE duplex- since no acute DVT no indication for IVC filter at this time   Hypertension  -Controlled, continue metoprolol  CKD stage 3, GFR 30-59 ml/min  -Cr 1.46, stable. -Renal U/S showed no hydronephrosis or masses  Hyperkalemia  -Resolved, likely due to blood break down in large retroperitoneal hemorrhage -Will  continue to monitor  Code Status: Full  Family Communication: Wife and daughter at bedside  Disposition Plan: Admitted. Pending CIR consult.  Procedures  Bilateral lower extremity venous Doppler- negative for DVT Renal US: Examination limited by patient's habitus. This particularly limits evaluation of the right kidney. No obvious hydronephrosis or renal mass identified. Bladder diverticulum.  Consults   PCCM  DVT Prophylaxis SCDs   Lab Results  Component Value Date   PLT 167 11/08/2013    Medications  Scheduled Meds: . antiseptic oral rinse  15 mL Mouth Rinse BID  . dofetilide  250 mcg Oral BID  . metoprolol  200 mg Oral Daily   Continuous Infusions:  PRN Meds:.haloperidol lactate, HYDROmorphone (DILAUDID) injection, ondansetron (ZOFRAN) IV, traMADol  Antibiotics    Anti-infectives   None       Time Spent in minutes   30 minutes   Jaxten Brosh D.O. on 11/08/2013 at 2:47 PM  Between 7am to 7pm - Pager - [phone]  After 7pm go to [email or web address] - password [email or web address]  And look for the night coverage person covering for me after hours  [facility] Office  [phone]    Subjective:   Jeff Henson  seen and examined today.   Patient continues to complain of right foot pain however improved.  Objective:   Filed Vitals:   11/07/13 2140 11/08/13 0624 11/08/13 1013 11/08/13 1343  BP: 162/77 147/62 139/59 147/60  Pulse: 100 78 88 75  Temp: 98.7 F (37.1 C) 97.3 F (36.3 C) 97.8 F (36.6 C) 97.7 F (36.5 C)  TempSrc: Oral Oral Oral Oral  Resp: 20 20 18 18   Height:      Weight:      SpO2: 98% 96% 95% 98%    Wt Readings from Last 3 Encounters:  11/07/13 125 kg (275 lb 9.2 oz)     Intake/Output Summary (Last 24 hours) at 11/08/13 1447 Last data filed at 11/08/13 1300  Gross per 24 hour  Intake    840 ml  Output   1001 ml  Net   -161 ml    Exam  General: Well developed, well nourished, NAD, appears stated age  HEENT:  NCAT, PERRLA, EOMI, Anicteic Sclera, mucous membranes moist.   Neck: Supple, no JVD, no masses  Cardiovascular: S1 S2 auscultated, no rubs, murmurs or gallops. Regular rate and rhythm.  Respiratory: Clear to auscultation bilaterally with equal chest rise  Abdomen: Soft, nontender, nondistended, + bowel sounds  Extremities: warm dry without cyanosis clubbing.  Mild edema noted on the right foot.  Neuro: AAOx3, cranial nerves grossly intact.   Skin: Without rashes exudates or nodules  Psych: Normal affect and demeanor with intact judgement and insight  Data Review   Micro Results Recent Results (from the past 240 hour(s))  MRSA PCR SCREENING     Status: None   Collection Time    11/04/13  4:38 PM      Result Value Range Status   MRSA by PCR NEGATIVE  NEGATIVE Final   Comment:            The GeneXpert MRSA Assay (FDA     approved for NASAL specimens     only), is one component of a     comprehensive MRSA colonization     surveillance program. It is not     intended to diagnose MRSA     infection nor to guide or     monitor treatment for     MRSA infections.    Radiology Reports Dg Foot Complete Right  11/07/2013   CLINICAL DATA:  Right foot pain and swelling  EXAM: RIGHT FOOT COMPLETE - 3+ VIEW  COMPARISON:  None.  FINDINGS: There is generalized osteopenia. There is no fracture or dislocation. There are mild degenerative changes of the 1st MTP joint. There is mild soft tissue swelling over the dorsal aspect of the foot. Marland Kitchen  IMPRESSION: No acute osseous injury of the right foot.   Electronically Signed   By: Elige Ko   On: 11/07/2013 12:26    CBC  Recent Labs Lab 11/06/13 1935 11/07/13 0806 11/07/13 2026 11/08/13 0545 11/08/13 0935  WBC 16.2* 15.6* 14.6* 14.2* 12.3*  HGB 11.1* 11.4* 10.2* 10.3* 10.4*  HCT 34.3* 35.2* 31.8* 32.0* 33.1*  PLT 150 144* 145* 166 167  MCV 102.1* 101.7* 101.6* 102.2* 102.2*  MCH 33.0 32.9 32.6 32.9 32.1  MCHC 32.4 32.4 32.1 32.2  31.4  RDW 13.6 13.4 13.5 13.4 13.3    Chemistries   Recent Labs Lab 11/04/13 1700 11/05/13 0120 11/07/13 0806 11/08/13 0545  NA 142 141 145 147  K 5.7* 5.7* 4.7 5.0  CL 110 109 105 107  CO2 23  26 30 30   GLUCOSE 127* 135* 103* 115*  BUN 19 20 28* 32*  CREATININE 1.44* 1.53* 1.54* 1.46*  CALCIUM 8.0* 7.9* 9.0 8.5  MG 1.8  --   --   --   AST 20  --   --   --   ALT 15  --   --   --   ALKPHOS 47  --   --   --   BILITOT 0.5  --   --   --    ------------------------------------------------------------------------------------------------------------------ estimated creatinine clearance is 63.4 ml/min (by C-G formula based on Cr of 1.46). ------------------------------------------------------------------------------------------------------------------ No results found for this basename: HGBA1C,  in the last 72 hours ------------------------------------------------------------------------------------------------------------------ No results found for this basename: CHOL, HDL, LDLCALC, TRIG, CHOLHDL, LDLDIRECT,  in the last 72 hours ------------------------------------------------------------------------------------------------------------------ No results found for this basename: TSH, T4TOTAL, FREET3, T3FREE, THYROIDAB,  in the last 72 hours ------------------------------------------------------------------------------------------------------------------ No results found for this basename: VITAMINB12, FOLATE, FERRITIN, TIBC, IRON, RETICCTPCT,  in the last 72 hours  Coagulation profile  Recent Labs Lab 11/04/13 1700  INR 1.06    No results found for this basename: DDIMER,  in the last 72 hours  Cardiac Enzymes  Recent Labs Lab 11/04/13 2020  TROPONINI <0.30   ------------------------------------------------------------------------------------------------------------------ No components found with this basename: POCBNP,

## 2013-11-09 DIAGNOSIS — I5032 Chronic diastolic (congestive) heart failure: Secondary | ICD-10-CM

## 2013-11-09 LAB — BASIC METABOLIC PANEL
BUN: 34 mg/dL — ABNORMAL HIGH (ref 6–23)
CO2: 27 mEq/L (ref 19–32)
Calcium: 8.7 mg/dL (ref 8.4–10.5)
Chloride: 106 mEq/L (ref 96–112)
Creatinine, Ser: 1.49 mg/dL — ABNORMAL HIGH (ref 0.50–1.35)
GFR calc Af Amer: 53 mL/min — ABNORMAL LOW (ref >90)
GFR calc non Af Amer: 46 mL/min — ABNORMAL LOW (ref >90)
Glucose, Bld: 108 mg/dL — ABNORMAL HIGH (ref 70–99)
Potassium: 4.3 mEq/L (ref 3.7–5.3)
Sodium: 146 mEq/L (ref 137–147)

## 2013-11-09 LAB — CBC
HCT: 32.7 % — ABNORMAL LOW (ref 39.0–52.0)
Hemoglobin: 10.5 g/dL — ABNORMAL LOW (ref 13.0–17.0)
MCH: 32.3 pg (ref 26.0–34.0)
MCHC: 32.1 g/dL (ref 30.0–36.0)
MCV: 100.6 fL — ABNORMAL HIGH (ref 78.0–100.0)
Platelets: 181 10*3/uL (ref 150–400)
RBC: 3.25 MIL/uL — ABNORMAL LOW (ref 4.22–5.81)
RDW: 13 % (ref 11.5–15.5)
WBC: 10.8 10*3/uL — ABNORMAL HIGH (ref 4.0–10.5)

## 2013-11-09 MED ORDER — METHYLPREDNISOLONE ACETATE 40 MG/ML IJ SUSP
80.0000 mg | Freq: Once | INTRAMUSCULAR | Status: AC
  Administered 2013-11-09: 80 mg via INTRAMUSCULAR
  Filled 2013-11-09: qty 2

## 2013-11-09 MED ORDER — DIPHENHYDRAMINE HCL 25 MG PO CAPS
50.0000 mg | ORAL_CAPSULE | Freq: Three times a day (TID) | ORAL | Status: DC | PRN
  Administered 2013-11-09 – 2013-11-10 (×2): 50 mg via ORAL
  Filled 2013-11-09 (×2): qty 2

## 2013-11-09 NOTE — Progress Notes (Addendum)
[facility]                                                                                Patient Demographics  Jeff Henson, is a 68 y.o. male, DOB - Mar 16, 1946, OVF:[id number]  Admit date - 11/04/2013   Admitting Physician Doree Fudge, MD  Outpatient Primary MD for the patient is No PCP Per Patient  LOS - 5   No chief complaint on file.       Assessment & Plan  Active Problems:   Retroperitoneal bleeding   Acute blood loss anemia   Coagulopathy   History of pulmonary embolism   DVT (deep venous thrombosis)   Atrial fibrillation   Chronic diastolic heart failure   Left ventricular dilatation   HTN (hypertension)   CKD (chronic kidney disease) stage 3, GFR 30-59 ml/min  Retroperitoneal bleeding  -Secondary to anticoagulation -Symptomatic treatment since this is self-limiting process  -hold anti coagulation for at least 3-4 weeks (risk outweighs benefit)  -Hemoglobin currently stable 10.5/32.7  Acute blood loss anemia - due to above  -H/H stable 10.5 -Will continue to monitor  Coagulopathy  -due to Eliquis - now corrected   Atrial fibrillation  -maintaining NSR on Tikosyn and Metoprolol  -not a candidate for anti coagulation for a minimum of 2-4 weeks   R foot pain  -very tender over dorsum  -given hemolysis associated with retroperitoneal bleed high risk for gout  -Foot Xray: No acute osseous injury of the right foot. Mild degenerative changes of 1st MTP joint -Will give steroid IM injection to help with pain  Chronic diastolic heart failure/ Left ventricular dilatation  -compensated   History of pulmonary embolism/DVT (deep venous thrombosis)  -PCCM performed LE duplex- since no acute DVT no indication for IVC filter at this time   Hypertension  -Controlled, continue metoprolol  CKD stage 3, GFR 30-59 ml/min  -Cr 1.49, stable. -Renal U/S showed no hydronephrosis or masses  Hyperkalemia  -Resolved, likely due to blood break  down in large retroperitoneal hemorrhage -Will continue to monitor  Rash -Possible drug reaction or secondary to heat -benadryl PO -will continue to monitor  Code Status: Full  Family Communication: Wife at bedside  Disposition Plan: Admitted. Pending CIR/SNF placement.  Procedures  Bilateral lower extremity venous Doppler- negative for DVT Renal US: Examination limited by patient's habitus. This particularly limits evaluation of the right kidney. No obvious hydronephrosis or renal mass identified. Bladder diverticulum.  Consults   PCCM  DVT Prophylaxis SCDs   Lab Results  Component Value Date   PLT 181 11/09/2013    Medications  Scheduled Meds: . antiseptic oral rinse  15 mL Mouth Rinse BID  . dofetilide  250 mcg Oral BID  . methylPREDNISolone acetate  80 mg Intramuscular Once  . metoprolol  200 mg Oral Daily   Continuous Infusions:  PRN Meds:.diphenhydrAMINE, haloperidol lactate, HYDROmorphone (DILAUDID) injection, ondansetron (ZOFRAN) IV, traMADol  Antibiotics    Anti-infectives   None       Time Spent in minutes   30 minutes   Donato Studley D.O. on 11/09/2013 at 12:57 PM  Between 7am to 7pm - Pager - [phone]  After 7pm go to  [email or web address] - password [email or web address]  And look for the night coverage person covering for me after hours  [facility] Office  [phone]    Subjective:   Jeff Henson seen and examined today.   Patient continues to complain of right foot pain and swelling. Patient states that he feels he can go home, however, wife at bedside disagrees.  Objective:   Filed Vitals:   11/08/13 1343 11/08/13 2037 11/09/13 0450 11/09/13 1148  BP: 147/60 142/59 144/65 178/78  Pulse: 75 84 81 83  Temp: 97.7 F (36.5 C) 97.3 F (36.3 C) 98.8 F (37.1 C) 99.1 F (37.3 C)  TempSrc: Oral Oral Oral Oral  Resp: 18 19 17 18   Height:      Weight:  124.3 kg (274 lb 0.5 oz)    SpO2: 98% 95% 96% 91%    Wt Readings from Last 3  Encounters:  11/08/13 124.3 kg (274 lb 0.5 oz)     Intake/Output Summary (Last 24 hours) at 11/09/13 1257 Last data filed at 11/09/13 0900  Gross per 24 hour  Intake    600 ml  Output    600 ml  Net      0 ml    Exam  General: Well developed, well nourished, NAD, appears stated age  HEENT: NCAT, PERRLA, EOMI, Anicteic Sclera, mucous membranes moist.   Neck: Supple, no JVD, no masses  Cardiovascular: S1 S2 auscultated,  Regular rate and rhythm.  Respiratory: Clear to auscultation bilaterally with equal chest rise  Abdomen: Soft, nontender, nondistended, + bowel sounds  Extremities: warm dry without cyanosis clubbing.  Mild edema noted on the right foot.  Neuro: AAOx3, cranial nerves grossly intact.   Skin: Without rashes exudates or nodules  Psych: Normal affect and demeanor with intact judgement and insight  Data Review   Micro Results Recent Results (from the past 240 hour(s))  MRSA PCR SCREENING     Status: None   Collection Time    11/04/13  4:38 PM      Result Value Range Status   MRSA by PCR NEGATIVE  NEGATIVE Final   Comment:            The GeneXpert MRSA Assay (FDA     approved for NASAL specimens     only), is one component of a     comprehensive MRSA colonization     surveillance program. It is not     intended to diagnose MRSA     infection nor to guide or     monitor treatment for     MRSA infections.    Radiology Reports Dg Foot Complete Right  11/07/2013   CLINICAL DATA:  Right foot pain and swelling  EXAM: RIGHT FOOT COMPLETE - 3+ VIEW  COMPARISON:  None.  FINDINGS: There is generalized osteopenia. There is no fracture or dislocation. There are mild degenerative changes of the 1st MTP joint. There is mild soft tissue swelling over the dorsal aspect of the foot. Marland Kitchen  IMPRESSION: No acute osseous injury of the right foot.   Electronically Signed   By: Kathreen Devoid   On: 11/07/2013 12:26    CBC  Recent Labs Lab 11/07/13 2026 11/08/13 0545  11/08/13 0935 11/08/13 1850 11/09/13 0745  WBC 14.6* 14.2* 12.3* 11.2* 10.8*  HGB 10.2* 10.3* 10.4* 10.8* 10.5*  HCT 31.8* 32.0* 33.1* 33.3* 32.7*  PLT 145* 166 167 169 181  MCV 101.6* 102.2* 102.2* 101.8* 100.6*  MCH 32.6 32.9 32.1 33.0 32.3  MCHC 32.1 32.2 31.4 32.4 32.1  RDW 13.5 13.4 13.3 13.3 13.0    Chemistries   Recent Labs Lab 11/04/13 1700 11/05/13 0120 11/07/13 0806 11/08/13 0545 11/09/13 0500  NA 142 141 145 147 146  K 5.7* 5.7* 4.7 5.0 4.3  CL 110 109 105 107 106  CO2 23 26 30 30 27   GLUCOSE 127* 135* 103* 115* 108*  BUN 19 20 28* 32* 34*  CREATININE 1.44* 1.53* 1.54* 1.46* 1.49*  CALCIUM 8.0* 7.9* 9.0 8.5 8.7  MG 1.8  --   --   --   --   AST 20  --   --   --   --   ALT 15  --   --   --   --   ALKPHOS 47  --   --   --   --   BILITOT 0.5  --   --   --   --    ------------------------------------------------------------------------------------------------------------------ estimated creatinine clearance is 61.9 ml/min (by C-G formula based on Cr of 1.49). ------------------------------------------------------------------------------------------------------------------ No results found for this basename: HGBA1C,  in the last 72 hours ------------------------------------------------------------------------------------------------------------------ No results found for this basename: CHOL, HDL, LDLCALC, TRIG, CHOLHDL, LDLDIRECT,  in the last 72 hours ------------------------------------------------------------------------------------------------------------------ No results found for this basename: TSH, T4TOTAL, FREET3, T3FREE, THYROIDAB,  in the last 72 hours ------------------------------------------------------------------------------------------------------------------ No results found for this basename: VITAMINB12, FOLATE, FERRITIN, TIBC, IRON, RETICCTPCT,  in the last 72 hours  Coagulation profile  Recent Labs Lab 11/04/13 1700  INR 1.06    No  results found for this basename: DDIMER,  in the last 72 hours  Cardiac Enzymes  Recent Labs Lab 11/04/13 2020  TROPONINI <0.30   ------------------------------------------------------------------------------------------------------------------ No components found with this basename: POCBNP,

## 2013-11-10 ENCOUNTER — Inpatient Hospital Stay (HOSPITAL_COMMUNITY)
Admission: RE | Admit: 2013-11-10 | Discharge: 2013-11-17 | DRG: 945 | Disposition: A | Discharge status: Home Health | Payer: Medicare Other | Source: Intra-hospital | Attending: Physical Medicine & Rehabilitation | Admitting: Physical Medicine & Rehabilitation

## 2013-11-10 DIAGNOSIS — D689 Coagulation defect, unspecified: Secondary | ICD-10-CM

## 2013-11-10 DIAGNOSIS — I824Z9 Acute embolism and thrombosis of unspecified deep veins of unspecified distal lower extremity: Secondary | ICD-10-CM | POA: Diagnosis present

## 2013-11-10 DIAGNOSIS — R5381 Other malaise: Secondary | ICD-10-CM

## 2013-11-10 DIAGNOSIS — D62 Acute posthemorrhagic anemia: Secondary | ICD-10-CM

## 2013-11-10 DIAGNOSIS — R58 Hemorrhage, not elsewhere classified: Secondary | ICD-10-CM | POA: Diagnosis present

## 2013-11-10 DIAGNOSIS — I5032 Chronic diastolic (congestive) heart failure: Secondary | ICD-10-CM | POA: Diagnosis present

## 2013-11-10 DIAGNOSIS — M129 Arthropathy, unspecified: Secondary | ICD-10-CM | POA: Diagnosis present

## 2013-11-10 DIAGNOSIS — N189 Chronic kidney disease, unspecified: Secondary | ICD-10-CM | POA: Diagnosis present

## 2013-11-10 DIAGNOSIS — K683 Retroperitoneal hematoma: Secondary | ICD-10-CM | POA: Diagnosis present

## 2013-11-10 DIAGNOSIS — I495 Sick sinus syndrome: Secondary | ICD-10-CM | POA: Diagnosis present

## 2013-11-10 DIAGNOSIS — Z86711 Personal history of pulmonary embolism: Secondary | ICD-10-CM

## 2013-11-10 DIAGNOSIS — I4891 Unspecified atrial fibrillation: Secondary | ICD-10-CM

## 2013-11-10 DIAGNOSIS — Z79899 Other long term (current) drug therapy: Secondary | ICD-10-CM

## 2013-11-10 DIAGNOSIS — I498 Other specified cardiac arrhythmias: Secondary | ICD-10-CM | POA: Diagnosis present

## 2013-11-10 DIAGNOSIS — K661 Hemoperitoneum: Secondary | ICD-10-CM | POA: Diagnosis present

## 2013-11-10 DIAGNOSIS — I509 Heart failure, unspecified: Secondary | ICD-10-CM | POA: Diagnosis present

## 2013-11-10 DIAGNOSIS — R339 Retention of urine, unspecified: Secondary | ICD-10-CM | POA: Diagnosis present

## 2013-11-10 DIAGNOSIS — G4700 Insomnia, unspecified: Secondary | ICD-10-CM | POA: Diagnosis present

## 2013-11-10 DIAGNOSIS — Z5189 Encounter for other specified aftercare: Principal | ICD-10-CM

## 2013-11-10 DIAGNOSIS — I10 Essential (primary) hypertension: Secondary | ICD-10-CM

## 2013-11-10 DIAGNOSIS — K219 Gastro-esophageal reflux disease without esophagitis: Secondary | ICD-10-CM | POA: Diagnosis present

## 2013-11-10 DIAGNOSIS — I129 Hypertensive chronic kidney disease with stage 1 through stage 4 chronic kidney disease, or unspecified chronic kidney disease: Secondary | ICD-10-CM | POA: Diagnosis present

## 2013-11-10 DIAGNOSIS — T45515A Adverse effect of anticoagulants, initial encounter: Secondary | ICD-10-CM | POA: Diagnosis present

## 2013-11-10 DIAGNOSIS — M109 Gout, unspecified: Secondary | ICD-10-CM | POA: Diagnosis present

## 2013-11-10 DIAGNOSIS — Z86718 Personal history of other venous thrombosis and embolism: Secondary | ICD-10-CM

## 2013-11-10 LAB — BASIC METABOLIC PANEL
BUN: 34 mg/dL — ABNORMAL HIGH (ref 6–23)
CALCIUM: 8.8 mg/dL (ref 8.4–10.5)
CO2: 29 meq/L (ref 19–32)
CREATININE: 1.5 mg/dL — AB (ref 0.50–1.35)
Chloride: 103 mEq/L (ref 96–112)
GFR calc Af Amer: 53 mL/min — ABNORMAL LOW (ref >90)
GFR calc non Af Amer: 46 mL/min — ABNORMAL LOW (ref >90)
Glucose, Bld: 136 mg/dL — ABNORMAL HIGH (ref 70–99)
Potassium: 4.2 mEq/L (ref 3.7–5.3)
SODIUM: 145 meq/L (ref 137–147)

## 2013-11-10 LAB — CBC
HCT: 34.1 % — ABNORMAL LOW (ref 39.0–52.0)
HEMOGLOBIN: 11.1 g/dL — AB (ref 13.0–17.0)
MCH: 32.5 pg (ref 26.0–34.0)
MCHC: 32.6 g/dL (ref 30.0–36.0)
MCV: 99.7 fL (ref 78.0–100.0)
Platelets: 207 10*3/uL (ref 150–400)
RBC: 3.42 MIL/uL — AB (ref 4.22–5.81)
RDW: 13.3 % (ref 11.5–15.5)
WBC: 9.5 10*3/uL (ref 4.0–10.5)

## 2013-11-10 MED ORDER — TRAMADOL HCL 50 MG PO TABS
50.0000 mg | ORAL_TABLET | Freq: Four times a day (QID) | ORAL | Status: DC | PRN
  Administered 2013-11-10 – 2013-11-16 (×10): 50 mg via ORAL
  Filled 2013-11-10 (×11): qty 1

## 2013-11-10 MED ORDER — DOFETILIDE 250 MCG PO CAPS
250.0000 ug | ORAL_CAPSULE | Freq: Two times a day (BID) | ORAL | Status: DC
  Administered 2013-11-10 – 2013-11-17 (×14): 250 ug via ORAL
  Filled 2013-11-10 (×16): qty 1

## 2013-11-10 MED ORDER — COLCHICINE 0.6 MG PO TABS
1.2000 mg | ORAL_TABLET | Freq: Once | ORAL | Status: AC
  Administered 2013-11-10: 1.2 mg via ORAL
  Filled 2013-11-10: qty 2

## 2013-11-10 MED ORDER — SORBITOL 70 % SOLN: 30.0000 mL | Freq: Every day | Status: DC | PRN

## 2013-11-10 MED ORDER — DIPHENHYDRAMINE HCL 50 MG PO CAPS: 50.0000 mg | ORAL_CAPSULE | Freq: Three times a day (TID) | ORAL | Status: DC | PRN

## 2013-11-10 MED ORDER — ONDANSETRON HCL 4 MG/2ML IJ SOLN
4.0000 mg | Freq: Four times a day (QID) | INTRAMUSCULAR | Status: DC | PRN
Start: 2013-11-10 — End: 2013-11-17

## 2013-11-10 MED ORDER — COLCHICINE 0.6 MG PO TABS: 0.6000 mg | ORAL_TABLET | Freq: Every day | ORAL | Status: DC

## 2013-11-10 MED ORDER — COLCHICINE 0.6 MG PO TABS
0.6000 mg | ORAL_TABLET | Freq: Once | ORAL | Status: AC
  Administered 2013-11-10: 0.6 mg via ORAL
  Filled 2013-11-10 (×2): qty 1

## 2013-11-10 MED ORDER — BIOTENE DRY MOUTH MT LIQD: 15.0000 mL | Freq: Two times a day (BID) | OROMUCOSAL | Status: DC

## 2013-11-10 MED ORDER — ONDANSETRON HCL 4 MG PO TABS: 4.0000 mg | ORAL_TABLET | Freq: Four times a day (QID) | ORAL | Status: DC | PRN

## 2013-11-10 MED ORDER — TRAMADOL HCL 50 MG PO TABS: 50.0000 mg | ORAL_TABLET | Freq: Four times a day (QID) | ORAL | Status: DC | PRN

## 2013-11-10 MED ORDER — BIOTENE DRY MOUTH MT LIQD
15.0000 mL | Freq: Two times a day (BID) | OROMUCOSAL | Status: DC
  Administered 2013-11-10 – 2013-11-17 (×6): 15 mL via OROMUCOSAL

## 2013-11-10 MED ORDER — ACETAMINOPHEN 325 MG PO TABS
325.0000 mg | ORAL_TABLET | ORAL | Status: DC | PRN
  Administered 2013-11-11: 650 mg via ORAL
  Filled 2013-11-10: qty 2

## 2013-11-10 MED ORDER — COLCHICINE 0.6 MG PO TABS
0.6000 mg | ORAL_TABLET | Freq: Every day | ORAL | Status: DC
  Administered 2013-11-11 – 2013-11-13 (×3): 0.6 mg via ORAL
  Filled 2013-11-10 (×7): qty 1

## 2013-11-10 MED ORDER — METOPROLOL SUCCINATE ER 100 MG PO TB24
200.0000 mg | ORAL_TABLET | Freq: Every day | ORAL | Status: DC
  Administered 2013-11-11 – 2013-11-17 (×7): 200 mg via ORAL
  Filled 2013-11-10 (×9): qty 2

## 2013-11-10 NOTE — PMR Pre-admission (Signed)
PMR Admission Coordinator Pre-Admission Assessment  Patient: Jeff Henson is an 68 y.o., male MRN: [id number] DOB: 02-29-1946 Height: 5\' 10"  (177.8 cm) Weight: 124.3 kg (274 lb 0.5 oz)              Insurance Information HMO:      PPO:       PCP:       IPA:       80/20:       OTHER:   PRIMARY: Medicare A only      Policy#: [id number]      Subscriber: Terie Purser CM Name:        Phone#:       Fax#:   Pre-Cert#:        Employer: Retired Benefits:  Phone #:       Name: Economist. Date: 07/10/09     Deduct: $1260      Out of Pocket Max: none      Life Max: unlimited CIR: 100%      SNF: 100 days Outpatient: 80%     Co-Pay: 20% Home Health: 100%      Co-Pay: none DME: 80%     Co-Pay: 20% Providers: patient's choice  SECONDARY: BCBS federal emp PPO      Policy#: [id number]      Subscriber: Terie Purser CM Name:        Phone#:       Fax#:   Pre-Cert#:        Employer: Retired Benefits:  Phone #: [phone]     Name:   Eff. Date:       Deduct:        Out of Pocket Max:        Life Max:   CIR:        SNF:   Outpatient:       Co-Pay:   Home Health:        Co-Pay:   DME:       Co-Pay:    Emergency Contact Information Contact Information   Name Relation Home Work Mobile   [place]ary Spouse [place]   Laren Boom [phone]       Current Medical History  Patient Admitting Diagnosis: Deconditioning after retroperitoneal hematoma   History of Present Illness: A 68 y.o. right-handed male with history of atrial fibrillation and pulmonary emboli maintained on Eliquis, chronic renal insufficiency with baseline creatinine 1.4. Patient independent and driving prior to admission. Admitted to [facility] 11/04/2013 with chest and back pain. CTA chest revealed retroperitoneal hemorrhage secondary to anticoagulation. Anti-coagulation held anticipate 3-4 weeks. Lower extremity Doppler showed no signs of DVT. Atrial  fibrillation maintained on Tikosyn as well as Toprol. Complaints of bilateral foot pain right greater than left x-ray of right foot showed no acute osseous injury there was some mild soft tissue swelling over the dorsal aspect of the right foot.  Uric acid level mildly elevated at 8.1. Patient did receive Depo-Medrol injection 11/09/2013 and placed on colchicine 11/10/2013. Physical and occupational therapy evaluation completed 11/07/2013 with recommendations for physical medicine rehabilitation consult to consider inpatient rehabilitation services. Patient was felt to be a good candidate for inpatient rehabilitation services and will be admitted for comprehensive rehabilitation program.    Past Medical History  Past Medical History  Diagnosis Date  . Hypertension   . Dysrhythmia   . Shortness of breath   . GERD (gastroesophageal reflux disease)   . Chronic kidney disease   . Headache(784.0)   .  Cancer april 2013    prostate, radiation & chemo  . Arthritis     hips and shoulders  . Acute blood loss anemia 11/04/2013    Family History  family history is not on file.  Prior Rehab/Hospitalizations:  Had outpatient therapy years ago.   Current Medications  Current facility-administered medications:antiseptic oral rinse (BIOTENE) solution 15 mL, 15 mL, Mouth Rinse, BID, Doree Fudge, MD, 15 mL at 11/10/13 0803;  [START ON 11/11/2013] colchicine tablet 0.6 mg, 0.6 mg, Oral, Daily, Maryann Mikhail, DO;  diphenhydrAMINE (BENADRYL) capsule 50 mg, 50 mg, Oral, Q8H PRN, Maryann Mikhail, DO, 50 mg at 11/10/13 0802 dofetilide (TIKOSYN) capsule 250 mcg, 250 mcg, Oral, BID, Rigoberto Noel, MD, 250 mcg at 11/10/13 0946;  haloperidol lactate (HALDOL) injection 2.5 mg, 2.5 mg, Intravenous, Q6H PRN, Jeryl Columbia, NP, 2.5 mg at 11/07/13 0138;  HYDROmorphone (DILAUDID) injection 0.5-1 mg, 0.5-1 mg, Intravenous, Q3H PRN, Doree Fudge, MD, 0.5 mg at 11/06/13 1802 metoprolol succinate  (TOPROL-XL) 24 hr tablet 200 mg, 200 mg, Oral, Daily, Cherene Altes, MD, 200 mg at 11/10/13 0946;  ondansetron (ZOFRAN) injection 4 mg, 4 mg, Intravenous, Q6H PRN, Rush Farmer, MD;  traMADol Veatrice Bourbon) tablet 50 mg, 50 mg, Oral, Q6H PRN, Maryann Mikhail, DO, 50 mg at 11/10/13 [id number]  Patients Current Diet: Cardiac  Precautions / Restrictions Precautions Precautions: Fall Restrictions Weight Bearing Restrictions: No   Prior Activity Level Community (5-7x/wk): Went out 3-4 X a week.  Home Assistive Devices / Equipment Home Assistive Devices/Equipment: None Home Equipment: None  Prior Functional Level Prior Function Level of Independence: Independent  Current Functional Level Cognition  Overall Cognitive Status: Impaired/Different from baseline Current Attention Level: Sustained Orientation Level: Oriented X4 Following Commands: Follows one step commands with increased time General Comments: pt initially very lethargic; arrousable with multimodal cues    Extremity Assessment (includes Sensation/Coordination)          ADLs  Eating/Feeding: Supervision/safety;Set up Where Assessed - Eating/Feeding: Bed level Grooming: Wash/dry hands;Wash/dry face;Teeth care;Minimal assistance Where Assessed - Grooming: Unsupported sitting Upper Body Bathing: Moderate assistance Where Assessed - Upper Body Bathing: Unsupported sitting Lower Body Bathing: Maximal assistance Where Assessed - Lower Body Bathing: Supine, head of bed up;Unsupported sitting;Lean right and/or left;Rolling right and/or left Upper Body Dressing: Moderate assistance Where Assessed - Upper Body Dressing: Unsupported sitting Lower Body Dressing: +1 Total assistance Where Assessed - Lower Body Dressing: Supine, head of bed up;Rolling right and/or left;Unsupported sitting Toilet Transfer: +1 Total assistance (unable due to Rt. ankle pain) Toileting - Clothing Manipulation and Hygiene: +1 Total assistance Where  Assessed - Toileting Clothing Manipulation and Hygiene: Supine, head of bed flat;Sit on 3-in-1 or toilet Transfers/Ambulation Related to ADLs: Pt unable to move sit to stand due to increased pain Rt ankle.  Pt.was able to scoot up EOB bed toward [facility] with min A ADL Comments: Pt attempted to don socks, but unable to access feet.  Pt very lethargic which limits his abillity to perform BADLs    Mobility  Bed Mobility: Supine to Sit;Sitting - Scoot to [employer] of Bed;Sit to Sidelying Right;Scooting to HOB Supine to Sit: 4: Min assist;With rails;HOB elevated Supine to Sit: Patient Percentage: 30% Sitting - Scoot to Edge of Bed: 4: Min assist Sit to Supine: 1: +2 Total assist;HOB flat Sit to Supine: Patient Percentage: 20% Sit to Sidelying Right: 4: Min assist;HOB flat Scooting to HOB: 1: +2 Total assist Scooting to [facility]: Patient Percentage: 70%    Transfers  Transfers: Not assessed    Ambulation / Gait / Stairs / Wheelchair Mobility  Ambulation/Gait Ambulation/Gait Assistance: Not tested (comment) Stairs: No Wheelchair Mobility Wheelchair Mobility: No    Posture / Balance Static Sitting Balance Static Sitting - Balance Support: Feet supported;No upper extremity supported Static Sitting - Level of Assistance: 6: Modified independent (Device/Increase time) Static Sitting - Comment/# of Minutes: much more stable sitting EOB today Dynamic Sitting Balance Dynamic Sitting - Balance Support: Feet supported;No upper extremity supported Dynamic Sitting - Level of Assistance: Other (comment) (min guard assist) Dynamic Sitting - Comments: when attempting to don socks    Special needs/care consideration BiPAP/CPAP No CPM No Continuous Drip IV No Dialysis No       Life Vest No Oxygen No Special Bed No Trach Size No Wound Vac (area) No       Skin Swelling B LE but worse on the right                              Bowel mgmt: Had BM 11/09/12 Bladder mgmt: Voiding in urinal Diabetic mgmt No     Previous Home Environment Living Arrangements: Spouse/significant other Available Help at Discharge: Family;Available PRN/intermittently;Friend(s) Type of Home: House Home Layout: One level Home Access: Stairs to enter Entrance Stairs-Rails: None Entrance Stairs-Number of Steps: 2 Home Care Services: No Additional Comments: pt very independent prior to admission; able to drive and babysit grandkids.  Wife is a Marine scientist who works part time  Discharge Living Setting Plans for Discharge Living Setting: Patient's home;House;Lives with (comment) (Lives with wife.) Type of Home at Discharge: House Discharge Home Layout: One level Discharge Home Access: Stairs to enter [place] of Steps: 2 Does the patient have any problems obtaining your medications?: No  Social/Family/Support Systems Patient Roles: Spouse;Parent (Has a wife and 2 sons, 1 daughter.) Contact Information: Dyon Rotert - wife Anticipated Caregiver: Wife when she is not working Anticipated Ambulance person Information: Stanton Kidney - wife (h) [phone] (c) [phone] Ability/Limitations of Caregiver: wife works PT 10 hrs Fri, Wed evening, every other Saturday Caregiver Availability: Intermittent Discharge Plan Discussed with Primary Caregiver: Yes Is Caregiver In Agreement with Plan?: Yes Does Caregiver/Family have Issues with Lodging/Transportation while Pt is in Rehab?: No  Goals/Additional Needs Patient/Family Goal for Rehab: PT/OT mod I goals Expected length of stay: 7-10 days Cultural Considerations: None Dietary Needs: Heart, thin liquids Equipment Needs: TBD Pt/Family Agrees to Admission and willing to participate: Yes Program Orientation Provided & Reviewed with Pt/Caregiver Including Roles  & Responsibilities: Yes  Decrease burden of Care through IP rehab admission: N/A  Possible need for SNF placement upon discharge: Not planned  Patient Condition: This patient's medical and  functional status has changed since the consult dated: 11/08/13 in which the Rehabilitation Physician determined and documented that the patient's condition is appropriate for intensive rehabilitative care in an inpatient rehabilitation facility. See "History of Present Illness" (above) for medical update. Functional changes are: Currently requiring total assist +2 for mobility. Patient's medical and functional status update has been discussed with the Rehabilitation physician and patient remains appropriate for inpatient rehabilitation. Will admit to inpatient rehab today.  Preadmission Screen Completed By:  Retta Diones, 11/10/2013 3:04 PM ______________________________________________________________________   Discussed status with Dr. Letta Pate on 11/10/13 at 1513 and received telephone approval for admission today.  Admission Coordinator:  Retta Diones, time1513/Date1/2/15

## 2013-11-10 NOTE — Progress Notes (Signed)
Rehab admissions - Evaluated for possible admission.  I spoke with patient and his wife at the bedside.  Wife prefers inpatient rehab rather that SNF.  Bed available and will admit to inpatient rehab today.  Call me for questions.  #[phone]

## 2013-11-10 NOTE — Progress Notes (Signed)
Pt was admitted to room 4W24 from [facility] signs are stable and patient is watching TV with his wife.

## 2013-11-10 NOTE — Discharge Summary (Signed)
Physician Discharge Summary  Jeff Henson [name] DOB: [birth year] DOA: 11/04/2013  PCP: No PCP Per Patient  Admit date: 11/04/2013 Discharge date: 11/10/2013  Time spent: 35 minutes  Recommendations for Outpatient Follow-up:  Patient will be discharged to inpatient rehabilitation. He should continue physical therapy as well as occupational therapy as set forth by the rehabilitation specialists. Patient to continue taking his medications as prescribed. He should avoid anticoagulation for approximately 4 weeks.   Discharge Diagnoses:  Active Problems:   Retroperitoneal bleeding , stable   Acute blood loss anemia, resolved   Coagulopathy, resolved   History of pulmonary embolism   DVT (deep venous thrombosis)   Atrial fibrillation, stable   Chronic diastolic heart failure, stable   Left ventricular dilatation   HTN (hypertension), stable   CKD (chronic kidney disease) stage 3, GFR 30-59 ml/min, stable   Discharge Condition: Stable  Diet recommendation: Heart/cardiac healthy  Filed Weights   11/06/13 0300 11/07/13 0618 11/08/13 2037  Weight: 125.6 kg (276 lb 14.4 oz) 125 kg (275 lb 9.2 oz) 124.3 kg (274 lb 0.5 oz)    History of present illness:  68 yo with h/o AF and PE ( on Eliquis ) who presented to [facility] 12/27 with chest / back pain. CT chest revealed retroperitoneal hemorrhage. FIBA was give, PRBC transfused and patient was transferred to [facility] for further management.   Hospital Course:  This is a 68 year old male history of atrial fibrillation as well as pulmonary embolism  was placed on Eliquis and presented with chest and back pain. CT of the chest revealed retroperitoneal hemorrhage. Patient had presented to [facility] and transferred to [facility]. Anticoagulation was reversed with FIBA. Patient received one unit of red blood cells. He remained stable. It was recommended that patient refrain from anticoagulation for approximately 3-4 weeks, as the risks  outweigh the benefits. Patient's hemoglobin has remained stable since admission. It seems as retroperitoneal bleeding is somewhat self-limiting. Patient also had acute blood loss anemia secondary to the retroperitoneal bleeding, which has remained stable. Again his coagulopathy has been reversed. Patient does have a history of atrial fibrillation, he has remained in normal sinus rhythm during his hospital course. He was continued on metoprolol as well as Tikosyn. Again he is not anticoagulation candidate for approximately 3-4 weeks. During his hospital course patient did complain of right foot pain, it was thought that this could be secondary to hemolysis from retroperitoneal bleed with a high risk of gout. Patient did have an elevated uric acid level of around 8. Patient was given one steroid IM injection to help with the pain, this helped some but not relieving his pain. Patient had a foot x-ray was conducted showing no acute osseous injury of the right foot, mild degenerative changes of the first MTP joint. Patient will be started on colchicine for possible pain relief, this should be followed and monitored. Patient also has a history of diastolic heart failure with left ventricular dilatation, he did remain compensated during his hospital course and euvolemic. Patient does have a history of pulmonary embolism as well as DVT. PCCM performed lower extremity duplex showing no acute DVT or indication for an IVC filter. Patient does have a history of CAD stage III this did remain stable. Renal ultrasound was also obtained showing no hydronephrosis or masses. Patient was also noted to have hyperkalemia which is likely secondary to blood break down from his large peritoneal hemorrhage. This did resolve and has remained stable. Patient was also noted to have  a rash on his back which is likely secondary to heat reaction. He was given Benadryl, the rash is improving. Patient has a history of hypertension, which remained  stable. Patient should continue his metoprolol and restart his home medications. Patient will be discharged to inpatient rehabilitation. He should continue physical therapy as well as occupational therapy. He should follow up with his primary care physician once discharged from inpatient rehabilitation. Patient to continue taking his medications as prescribed. This was discussed with the patient as well as his wife at bedside they do understand and agree.  Procedures: Bilateral lower extremity venous Doppler- negative for DVT  Renal US: Examination limited by patient's habitus. This particularly limits evaluation of the right kidney. No obvious hydronephrosis or renal mass identified. Bladder diverticulum.  Consultations: PCCM  Discharge Exam: Filed Vitals:   11/10/13 0900  BP: 155/87  Pulse:   Temp: 97.2 F (36.2 C)  Resp: 20   Exam  General: Well developed, well nourished, NAD, appears stated age  HEENT: NCAT, PERRLA, EOMI, Anicteic Sclera, mucous membranes moist.  Neck: Supple, no JVD, no masses  Cardiovascular: S1 S2 auscultated, Regular rate and rhythm.  Respiratory: Clear to auscultation bilaterally with equal chest rise  Abdomen: Soft, nontender, nondistended, + bowel sounds  Extremities: warm dry without cyanosis clubbing. Mild edema noted on the right foot.  Neuro: AAOx3, cranial nerves grossly intact.  Skin: Without rashes exudates or nodules  Psych: Normal affect and demeanor with intact judgement and insight  Discharge Instructions  Discharge Orders   Future Orders Complete By Expires   Diet - low sodium heart healthy  As directed    Discharge instructions  As directed    Comments:     Patient will be discharged to inpatient rehabilitation. He should continue physical therapy as well as occupational therapy as set forth by the rehabilitation specialists. Patient to continue taking his medications as prescribed. He should avoid anticoagulation for approximately 4  weeks.   Increase activity slowly  As directed        Medication List    STOP taking these medications       ELIQUIS 5 MG Tabs tablet  Generic drug:  apixaban      TAKE these medications       acetaminophen 500 MG tablet  Commonly known as:  TYLENOL  Take 1,000 mg by mouth every 6 (six) hours as needed for mild pain.     antiseptic oral rinse Liqd  15 mLs by Mouth Rinse route 2 (two) times daily.     colchicine 0.6 MG tablet  Take 1 tablet (0.6 mg total) by mouth daily.  Start taking on:  11/11/2013     diphenhydrAMINE 50 MG capsule  Commonly known as:  BENADRYL  Take 1 capsule (50 mg total) by mouth every 8 (eight) hours as needed for itching, allergies or sleep.     hydrALAZINE 25 MG tablet  Commonly known as:  APRESOLINE  Take 25 mg by mouth 3 (three) times daily.     lisinopril 20 MG tablet  Commonly known as:  PRINIVIL,ZESTRIL  Take 20 mg by mouth daily.     metoprolol 200 MG 24 hr tablet  Commonly known as:  TOPROL-XL  Take 200 mg by mouth daily.     TIKOSYN 250 MCG capsule  Generic drug:  dofetilide  Take 250 mcg by mouth 2 (two) times daily.     traMADol 50 MG tablet  Commonly known as:  ULTRAM  Take 1  tablet (50 mg total) by mouth every 6 (six) hours as needed for moderate pain or severe pain.       Allergies  Allergen Reactions  . Talwin [Pentazocine] Shortness Of Breath  . Levaquin [Levofloxacin]     Muscle aches  . Rivaroxaban Rash      The results of significant diagnostics from this hospitalization (including imaging, microbiology, ancillary and laboratory) are listed below for reference.    Significant Diagnostic Studies: US Renal  11/08/2013   CLINICAL DATA:  Acute renal failure.  Hypertension.  Morbid obesity.  EXAM: RENAL/URINARY TRACT ULTRASOUND COMPLETE  COMPARISON:  None.  FINDINGS: Right Kidney:  Length: 10 cm. Evaluation limited secondary to patient's habitus. No hydronephrosis or obvious mass.  Left Kidney:  Length: 10.9 cm.  Echogenicity within normal limits. No mass or hydronephrosis visualized.  Bladder:  Bladder diverticulum incidentally noted.  IMPRESSION: Examination limited by patient's habitus. This particularly limits evaluation of the right kidney. No obvious hydronephrosis or renal mass identified.  Bladder diverticulum.   Electronically Signed   By: Chauncey Cruel M.D.   On: 11/08/2013 14:03   Dg Foot Complete Right  11/07/2013   CLINICAL DATA:  Right foot pain and swelling  EXAM: RIGHT FOOT COMPLETE - 3+ VIEW  COMPARISON:  None.  FINDINGS: There is generalized osteopenia. There is no fracture or dislocation. There are mild degenerative changes of the 1st MTP joint. There is mild soft tissue swelling over the dorsal aspect of the foot. Marland Kitchen  IMPRESSION: No acute osseous injury of the right foot.   Electronically Signed   By: Kathreen Devoid   On: 11/07/2013 12:26    Microbiology: Recent Results (from the past 240 hour(s))  MRSA PCR SCREENING     Status: None   Collection Time    11/04/13  4:38 PM      Result Value Range Status   MRSA by PCR NEGATIVE  NEGATIVE Final   Comment:            The GeneXpert MRSA Assay (FDA     approved for NASAL specimens     only), is one component of a     comprehensive MRSA colonization     surveillance program. It is not     intended to diagnose MRSA     infection nor to guide or     monitor treatment for     MRSA infections.     Labs: Basic Metabolic Panel:  Recent Labs Lab 11/04/13 1700 11/05/13 0120 11/07/13 0806 11/08/13 0545 11/09/13 0500 11/10/13 0534  NA 142 141 145 147 146 145  K 5.7* 5.7* 4.7 5.0 4.3 4.2  CL 110 109 105 107 106 103  CO2 23 26 30 30 27 29   GLUCOSE 127* 135* 103* 115* 108* 136*  BUN 19 20 28* 32* 34* 34*  CREATININE 1.44* 1.53* 1.54* 1.46* 1.49* 1.50*  CALCIUM 8.0* 7.9* 9.0 8.5 8.7 8.8  MG 1.8  --   --   --   --   --   PHOS 3.3  --   --   --   --   --    Liver Function Tests:  Recent Labs Lab 11/04/13 1700  AST 20  ALT 15    ALKPHOS 47  BILITOT 0.5  PROT 6.3  ALBUMIN 3.4*   No results found for this basename: LIPASE, AMYLASE,  in the last 168 hours No results found for this basename: AMMONIA,  in the last 168 hours CBC:  Recent Labs Lab 11/08/13 0545 11/08/13 0935 11/08/13 1850 11/09/13 0745 11/10/13 0534  WBC 14.2* 12.3* 11.2* 10.8* 9.5  HGB 10.3* 10.4* 10.8* 10.5* 11.1*  HCT 32.0* 33.1* 33.3* 32.7* 34.1*  MCV 102.2* 102.2* 101.8* 100.6* 99.7  PLT 166 167 169 181 207   Cardiac Enzymes:  Recent Labs Lab 11/04/13 2020  TROPONINI <0.30   BNP: BNP (last 3 results) No results found for this basename: PROBNP,  in the last 8760 hours CBG: No results found for this basename: GLUCAP,  in the last 168 hours     Signed:  Cristal Ford  [facility] 11/10/2013, 11:54 AM

## 2013-11-10 NOTE — H&P (Signed)
Physical Medicine and Rehabilitation Admission H&P  No chief complaint on file.  :  Chief complaint: Right foot pain  HPI: Jeff Henson is a 68 y.o. right-handed male with history of atrial fibrillation and pulmonary emboli maintained on Eliquis, chronic renal insufficiency with baseline creatinine 1.4. Patient independent and driving prior to admission. Admitted to [facility] 11/04/2013 with chest and back pain. CTA chest revealed retroperitoneal hemorrhage secondary to anticoagulation. Anti-coagulation held anticipate 3-4 weeks. Lower extremity Doppler showed no signs of DVT. Atrial fibrillation maintained on Tikosyn as well as Toprol. Complaints of bilateral foot pain right greater than left x-ray of right foot showed no acute osseous injury there was some mild soft tissue swelling over the dorsal aspect of the right foot.  Uric acid level mildly elevated at 8.1. Patient did receive Depo-Medrol injection 11/09/2013 and placed on colchicine 11/10/2013. Physical and occupational therapy evaluation completed 11/07/2013 with recommendations for physical medicine rehabilitation consult to consider inpatient rehabilitation services. Patient was felt to be a good candidate for inpatient rehabilitation services and was admitted for comprehensive rehabilitation program   LLE pain has subsided, now with R foot and knee pain No numbness in R foot , no sig back pain  ROS Review of Systems  Respiratory: Positive for shortness of breath.  Cardiovascular: Positive for leg swelling.  Gastrointestinal:  GERD  Musculoskeletal: Positive for myalgias.  Neurological: Positive for headaches.  All other systems reviewed and are negative  Past Medical History   Diagnosis  Date   .  Hypertension    .  Dysrhythmia    .  Shortness of breath    .  GERD (gastroesophageal reflux disease)    .  Chronic kidney disease    .  Headache(784.0)    .  Cancer  april 2013     prostate, radiation &  chemo   .  Arthritis      hips and shoulders   .  Acute blood loss anemia  11/04/2013    Past Surgical History   Procedure  Laterality  Date   .  Cardiac catheterization   2007     ablation   .  Cardiac catheterization   2013     for chest pain, no CAD noted   .  Tonsillectomy       as child    No family history on file.  Social History: reports that he quit smoking about 30 years ago. His smoking use included Pipe and Cigars. He does not have any smokeless tobacco history on file. He reports that he does not drink alcohol or use illicit drugs.  Allergies:  Allergies   Allergen  Reactions   .  Talwin [Pentazocine]  Shortness Of Breath   .  Levaquin [Levofloxacin]      Muscle aches   .  Rivaroxaban  Rash    Medications Prior to Admission   Medication  Sig  Dispense  Refill   .  acetaminophen (TYLENOL) 500 MG tablet  Take 1,000 mg by mouth every 6 (six) hours as needed for mild pain.     Marland Kitchen  apixaban (ELIQUIS) 5 MG TABS tablet  Take 5 mg by mouth 2 (two) times daily.     Marland Kitchen  dofetilide (TIKOSYN) 250 MCG capsule  Take 250 mcg by mouth 2 (two) times daily.     .  hydrALAZINE (APRESOLINE) 25 MG tablet  Take 25 mg by mouth 3 (three) times daily.     Marland Kitchen  lisinopril (  PRINIVIL,ZESTRIL) 20 MG tablet  Take 20 mg by mouth daily.     .  metoprolol (TOPROL-XL) 200 MG 24 hr tablet  Take 200 mg by mouth daily.      Home:  Home Living  Family/patient expects to be discharged to:: Private residence  Living Arrangements: Spouse/significant other  Available Help at Discharge: Family;Available PRN/intermittently;Friend(s)  Type of Home: House  Home Access: Stairs to enter  [employer] of Steps: 2  Entrance Stairs-Rails: None  Home Layout: One level  Home Equipment: None  Additional Comments: pt very independent prior to admission; able to drive and babysit grandkids. Wife is a Marine scientist who works part time  Functional History:   Functional Status:  Mobility:  Bed Mobility  Bed  Mobility: Supine to Sit;Sitting - Scoot to [employer] of Bed;Sit to Sidelying Right;Scooting to HOB  Supine to Sit: 4: Min assist;With rails;HOB elevated  Supine to Sit: Patient Percentage: 30%  Sitting - Scoot to Edge of Bed: 4: Min assist  Sit to Supine: 1: +2 Total assist;HOB flat  Sit to Supine: Patient Percentage: 20%  Sit to Sidelying Right: 4: Min assist;HOB flat  Scooting to HOB: 1: +2 Total assist  Scooting to [facility]: Patient Percentage: 70%  Transfers  Transfers: Not assessed  Ambulation/Gait  Ambulation/Gait Assistance: Not tested (comment)  Stairs: No  Wheelchair Mobility  Wheelchair Mobility: No  ADL:  ADL  Eating/Feeding: Supervision/safety;Set up  Where Assessed - Eating/Feeding: Bed level  Grooming: Wash/dry hands;Wash/dry face;Teeth care;Minimal assistance  Where Assessed - Grooming: Unsupported sitting  Upper Body Bathing: Moderate assistance  Where Assessed - Upper Body Bathing: Unsupported sitting  Lower Body Bathing: Maximal assistance  Where Assessed - Lower Body Bathing: Supine, head of bed up;Unsupported sitting;Lean right and/or left;Rolling right and/or left  Upper Body Dressing: Moderate assistance  Where Assessed - Upper Body Dressing: Unsupported sitting  Lower Body Dressing: +1 Total assistance  Where Assessed - Lower Body Dressing: Supine, head of bed up;Rolling right and/or left;Unsupported sitting  Toilet Transfer: +1 Total assistance (unable due to Rt. ankle pain)  Transfers/Ambulation Related to ADLs: Pt unable to move sit to stand due to increased pain Rt ankle. Pt.was able to scoot up EOB bed toward [facility] with min A  ADL Comments: Pt attempted to don socks, but unable to access feet. Pt very lethargic which limits his abillity to perform BADLs  Cognition:  Cognition  Overall Cognitive Status: Impaired/Different from baseline  Orientation Level: Oriented X4  Cognition  Arousal/Alertness: Awake/alert  Behavior During Therapy: Flat affect  Overall  Cognitive Status: Impaired/Different from baseline  Area of Impairment: Following commands;Problem solving  Orientation Level: Disoriented to;Place;Situation;Time  Current Attention Level: Sustained  Memory: Decreased short-term memory  Following Commands: Follows one step commands with increased time  Problem Solving: Slow processing  General Comments: pt initially very lethargic; arrousable with multimodal cues  Physical Exam:  Blood pressure 177/78, pulse 97, temperature 98.3 F (36.8 C), temperature source Oral, resp. rate 20, height 5' 10"  (1.778 m), weight 124.3 kg (274 lb 0.5 oz), SpO2 92.00%.  Physical Exam  Constitutional: He is oriented to person, place, and time.  HENT:  Head: Normocephalic.  Eyes: EOM are normal.  Neck: Normal range of motion. Neck supple. No thyromegaly present.  Cardiovascular:  Cardiac rate controlled  Respiratory: Effort normal and breath sounds normal. No respiratory distress.  GI: Soft. Bowel sounds are normal. He exhibits no distension. There is no tenderness.  Musculoskeletal:  Right foot and dorsal tenderness with palpable  pedal pulses Swelling R>L foot and ankle, tenderness with right great toe ROM   Mild effusion R knee pain with AROM/PROM Mild pain but no swelling L knee  Neurological: He is alert and oriented to person, place, and time. No sensory deficit.  Strength is 5/5 in bilateral deltoid, bicep, tricep, grip 3 minus/5 Right knee extensor, 2 minus in the ankle dorsiflexor and plantar flexor, 2 minus in the hip flexor all limited by pain, Left HF,KE,ADF 4/5 Skin: Skin is warm and dry  Results for orders placed during the hospital encounter of 11/04/13 (from the past 48 hour(s))   CBC Status: Abnormal    Collection Time    11/08/13 9:35 AM   Result  Value  Range    WBC  12.3 (*)  4.0 - 10.5 K/uL    RBC  3.24 (*)  4.22 - 5.81 MIL/uL    Hemoglobin  10.4 (*)  13.0 - 17.0 g/dL    HCT  33.1 (*)  39.0 - 52.0 %    MCV  102.2 (*)  78.0 -  100.0 fL    MCH  32.1  26.0 - 34.0 pg    MCHC  31.4  30.0 - 36.0 g/dL    RDW  13.3  11.5 - 15.5 %    Platelets  167  150 - 400 K/uL   CBC Status: Abnormal    Collection Time    11/08/13 6:50 PM   Result  Value  Range    WBC  11.2 (*)  4.0 - 10.5 K/uL    RBC  3.27 (*)  4.22 - 5.81 MIL/uL    Hemoglobin  10.8 (*)  13.0 - 17.0 g/dL    HCT  33.3 (*)  39.0 - 52.0 %    MCV  101.8 (*)  78.0 - 100.0 fL    MCH  33.0  26.0 - 34.0 pg    MCHC  32.4  30.0 - 36.0 g/dL    RDW  13.3  11.5 - 15.5 %    Platelets  169  150 - 400 K/uL   BASIC METABOLIC PANEL Status: Abnormal    Collection Time    11/09/13 5:00 AM   Result  Value  Range    Sodium  146  137 - 147 mEq/L    Comment:  Please note change in reference range.    Potassium  4.3  3.7 - 5.3 mEq/L    Comment:  Please note change in reference range.    Chloride  106  96 - 112 mEq/L    CO2  27  19 - 32 mEq/L    Glucose, Bld  108 (*)  70 - 99 mg/dL    BUN  34 (*)  6 - 23 mg/dL    Creatinine, Ser  1.49 (*)  0.50 - 1.35 mg/dL    Calcium  8.7  8.4 - 10.5 mg/dL    GFR calc non Af Amer  46 (*)  >90 mL/min    GFR calc Af Amer  53 (*)  >90 mL/min    Comment:  (NOTE)     The eGFR has been calculated using the CKD EPI equation.     This calculation has not been validated in all clinical situations.     eGFR's persistently <90 mL/min signify possible Chronic Kidney     Disease.   CBC Status: Abnormal    Collection Time    11/09/13 7:45 AM   Result  Value  Range  WBC  10.8 (*)  4.0 - 10.5 K/uL    RBC  3.25 (*)  4.22 - 5.81 MIL/uL    Hemoglobin  10.5 (*)  13.0 - 17.0 g/dL    HCT  32.7 (*)  39.0 - 52.0 %    MCV  100.6 (*)  78.0 - 100.0 fL    MCH  32.3  26.0 - 34.0 pg    MCHC  32.1  30.0 - 36.0 g/dL    RDW  13.0  11.5 - 15.5 %    Platelets  181  150 - 400 K/uL   CBC Status: Abnormal    Collection Time    11/10/13 5:34 AM   Result  Value  Range    WBC  9.5  4.0 - 10.5 K/uL    RBC  3.42 (*)  4.22 - 5.81 MIL/uL    Hemoglobin  11.1 (*)  13.0 -  17.0 g/dL    HCT  34.1 (*)  39.0 - 52.0 %    MCV  99.7  78.0 - 100.0 fL    MCH  32.5  26.0 - 34.0 pg    MCHC  32.6  30.0 - 36.0 g/dL    RDW  13.3  11.5 - 15.5 %    Platelets  207  150 - 400 K/uL   BASIC METABOLIC PANEL Status: Abnormal    Collection Time    11/10/13 5:34 AM   Result  Value  Range    Sodium  145  137 - 147 mEq/L    Comment:  Please note change in reference range.    Potassium  4.2  3.7 - 5.3 mEq/L    Comment:  Please note change in reference range.    Chloride  103  96 - 112 mEq/L    CO2  29  19 - 32 mEq/L    Glucose, Bld  136 (*)  70 - 99 mg/dL    BUN  34 (*)  6 - 23 mg/dL    Creatinine, Ser  1.50 (*)  0.50 - 1.35 mg/dL    Calcium  8.8  8.4 - 10.5 mg/dL    GFR calc non Af Amer  46 (*)  >90 mL/min    GFR calc Af Amer  53 (*)  >90 mL/min    Comment:  (NOTE)     The eGFR has been calculated using the CKD EPI equation.     This calculation has not been validated in all clinical situations.     eGFR's persistently <90 mL/min signify possible Chronic Kidney     Disease.    US Renal  11/08/2013 CLINICAL DATA: Acute renal failure. Hypertension. Morbid obesity. EXAM: RENAL/URINARY TRACT ULTRASOUND COMPLETE COMPARISON: None. FINDINGS: Right Kidney: Length: 10 cm. Evaluation limited secondary to patient's habitus. No hydronephrosis or obvious mass. Left Kidney: Length: 10.9 cm. Echogenicity within normal limits. No mass or hydronephrosis visualized. Bladder: Bladder diverticulum incidentally noted. IMPRESSION: Examination limited by patient's habitus. This particularly limits evaluation of the right kidney. No obvious hydronephrosis or renal mass identified. Bladder diverticulum. Electronically Signed By: Chauncey Cruel M.D. On: 11/08/2013 14:03   Post Admission Physician Evaluation:  1. Functional deficits secondary to Deconditioning after retroperitoneal hematoma, with RLE gout flare. 2. Patient is admitted to receive collaborative, interdisciplinary care between the  physiatrist, rehab nursing staff, and therapy team. 3. Patient's level of medical complexity and substantial therapy needs in context of that medical necessity cannot be provided at a lesser intensity of care such  as a SNF. 4. Patient has experienced substantial functional loss from his/her baseline which was documented above under the "Functional History" and "Functional Status" headings. Judging by the patient's diagnosis, physical exam, and functional history, the patient has potential for functional progress which will result in measurable gains while on inpatient rehab. These gains will be of substantial and practical use upon discharge in facilitating mobility and self-care at the household level. 5. Physiatrist will provide 24 hour management of medical needs as well as oversight of the therapy plan/treatment and provide guidance as appropriate regarding the interaction of the two. 6. 24 hour rehab nursing will assist with bowel management, safety, skin/wound care, disease management, medication administration, pain management and patient education and help integrate therapy concepts, techniques,education, etc. 7. PT will assess and treat for/with: pre gait, gait training, endurance , safety, equipment, neuromuscular re education. Goals are: Sup/Mod I. 8. OT will assess and treat for/with: ADLs, Cognitive perceptual skills, Neuromuscular re education, safety, endurance, equipment. Goals are: Sup/Mod I ADL. 9. SLP will assess and treat for/with: NA. Goals are: NA. 10. Case Management and Social Worker will assess and treat for psychological issues and discharge planning. 11. Team conference will be held weekly to assess progress toward goals and to determine barriers to discharge. 12. Patient will receive at least 3 hours of therapy per day at least 5 days per week. 13. ELOS: 2wk  14. Prognosis: good Medical Problem List and Plan:  1. Deconditioning after a retroperitoneal hematoma felt to be  secondary to anti-coagulation.Eliquis to be held 3-4 weeks  2. DVT Prophylaxis/Anticoagulation: SCDs. Recent venous Doppler studies negative  3. Pain Management: Ultram as needed. Monitor with increased mobility  4. Neuropsych: This patient is  capable of making decisions on his own behalf.  5. Suspect gout right lower extremity. Uric acid level 8.1. Received Depo-Medrol injection times one. Placed on colchicine 11/10/2013  6. Chronic renal insufficiency. Baseline creatinine 1 point and monitor. Repeat chemistries  7. Atrial fibrillation. Continue Tikosyn 250 mcg twice a day, Toprol-XL 200 mg daily. Again Eliquis to be held 3-4 weeks secondary to retroperitoneal hematoma. Cardiac rate remains stable   Charlett Blake M.D. [facility] FAAPM&R (Sports Med, Neuromuscular Med) Diplomate Am Board of Electrodiagnostic Med Diplomate Am Board of Pain Medicine Fellow Am Board of Interventional Pain Physicians  11/10/2013

## 2013-11-11 ENCOUNTER — Inpatient Hospital Stay (HOSPITAL_COMMUNITY): Payer: Medicare Other | Admitting: *Deleted

## 2013-11-11 ENCOUNTER — Inpatient Hospital Stay (HOSPITAL_COMMUNITY): Payer: Medicare Other | Admitting: Occupational Therapy

## 2013-11-11 DIAGNOSIS — D689 Coagulation defect, unspecified: Secondary | ICD-10-CM

## 2013-11-11 DIAGNOSIS — I4891 Unspecified atrial fibrillation: Secondary | ICD-10-CM

## 2013-11-11 DIAGNOSIS — D62 Acute posthemorrhagic anemia: Secondary | ICD-10-CM

## 2013-11-11 MED ORDER — HYDROCORTISONE 1 % EX CREA
1.0000 "application " | TOPICAL_CREAM | Freq: Three times a day (TID) | CUTANEOUS | Status: DC | PRN
  Administered 2013-11-11 – 2013-11-13 (×4): 1 via TOPICAL
  Filled 2013-11-11: qty 28

## 2013-11-11 NOTE — Progress Notes (Signed)
Patient ID: Jeff Henson, male   DOB: 22-Nov-1945, 68 y.o.   MRN: [id number]  45/2.  68 year old patient who is admitted to the rehabilitation service for management of deconditioning following a retroperitoneal bleed and secondary anemia. Patient has a history of atrial for ablation and prior pulmonary emboli and DVT treated with Eliquis. Additional medical problems include chronic diastolic heart failure hypertension and chronic kidney disease.  Patient Vitals for the past 24 hrs:  BP Temp Temp src Pulse Resp SpO2  11/11/13 0630 167/71 mmHg 97.7 F (36.5 C) Oral 84 19 94 %  11/10/13 1804 158/70 mmHg 98 F (36.7 C) Oral 82 20 91 %     Intake/Output Summary (Last 24 hours) at 11/11/13 1203 Last data filed at 11/11/13 9326  Gross per 24 hour  Intake    290 ml  Output    200 ml  Net     90 ml    Past Medical History  Diagnosis Date  . Hypertension   . Dysrhythmia   . Shortness of breath   . GERD (gastroesophageal reflux disease)   . Chronic kidney disease   . Headache(784.0)   . Cancer april 2013    prostate, radiation & chemo  . Arthritis     hips and shoulders  . Acute blood loss anemia 11/04/2013       Past Surgical History  Procedure Laterality Date  . Cardiac catheterization  2007    ablation  . Cardiac catheterization  2013    for chest pain, no CAD noted  . Tonsillectomy      as child    Current facility-administered medications:acetaminophen (TYLENOL) tablet 325-650 mg, 325-650 mg, Oral, Q4H PRN, Lavon Paganini Angiulli, PA-C;  antiseptic oral rinse (BIOTENE) solution 15 mL, 15 mL, Mouth Rinse, BID, Daniel J Angiulli, PA-C, 15 mL at 11/11/13 0757;  colchicine tablet 0.6 mg, 0.6 mg, Oral, Daily, Lavon Paganini Angiulli, PA-C, 0.6 mg at 11/11/13 0757 dofetilide (TIKOSYN) capsule 250 mcg, 250 mcg, Oral, BID, Lavon Paganini Angiulli, PA-C, 250 mcg at 11/11/13 7124;  hydrocortisone cream 1 % 1 application, 1 application, Topical, TID PRN, Marletta Lor, MD;  metoprolol succinate  (TOPROL-XL) 24 hr tablet 200 mg, 200 mg, Oral, Daily, Lavon Paganini Angiulli, PA-C, 200 mg at 11/11/13 0756;  ondansetron (ZOFRAN) injection 4 mg, 4 mg, Intravenous, Q6H PRN, Lavon Paganini Angiulli, PA-C ondansetron (ZOFRAN) tablet 4 mg, 4 mg, Oral, Q6H PRN, Lavon Paganini Angiulli, PA-C;  sorbitol 70 % solution 30 mL, 30 mL, Oral, Daily PRN, Lavon Paganini Angiulli, PA-C;  traMADol Veatrice Bourbon) tablet 50 mg, 50 mg, Oral, Q6H PRN, Lavon Paganini Angiulli, PA-C, 50 mg at 11/10/13 2151  Physical Exam  Constitutional: He is oriented to person, place, and time.  HENT:  Head: Normocephalic.  Eyes: EOM are normal.  Neck: Normal range of motion. Neck supple. No thyromegaly present.  Cardiovascular:  Cardiac rate controlled  Respiratory: Effort normal and breath sounds normal. No respiratory distress.  GI: Soft. Bowel sounds are normal. He exhibits no distension. There is no tenderness.  Musculoskeletal:  Right foot and dorsal tenderness with palpable pedal pulses Swelling R>L foot and ankle, tenderness with right great toe ROM   Mild effusion R knee pain with AROM/PROM Mild pain but no swelling L knee  Neurological: He is alert and oriented to person, place, and time. No sensory deficit.  Strength is 5/5 in bilateral deltoid, bicep, tricep, grip 3 minus/5 Right knee extensor, 2 minus in the ankle dorsiflexor and plantar flexor, 2  minus in the hip flexor all limited by pain, Left HF,KE,ADF 4/5  Skin: Pruritic erythematous papular rash involving the upper and mid back area   Medical Problem List and Plan:  1. Deconditioning after a retroperitoneal hematoma felt to be secondary to anti-coagulation.Eliquis to be held 3-4 weeks  2. DVT Prophylaxis/Anticoagulation: SCDs. Recent venous Doppler studies negative  3. Pain Management: Ultram as needed. Monitor with increased mobility  4. Neuropsych: This patient is capable of making decisions on his own behalf.  5. Suspect gout right lower extremity. Uric acid level 8.1. Received  Depo-Medrol injection times one. Placed on colchicine 11/10/2013  6. Chronic renal insufficiency. Baseline creatinine 1 point and monitor. Repeat chemistries  7. Atrial fibrillation. Continue Tikosyn 250 mcg twice a day, Toprol-XL 200 mg daily. Again Eliquis to be held 3-4 weeks secondary to retroperitoneal hematoma. Cardiac rate remains stable

## 2013-11-11 NOTE — Evaluation (Signed)
Physical Therapy Assessment and Plan  Patient Details  Name: Jeff Henson MRN: [id number] Date of Birth: [birth year]  PT Diagnosis: Muscle weakness Rehab Potential:Good  ELOS: 2 weeks   Today's Date: 11/11/2013  Problem List:  Patient Active Problem List   Diagnosis Date Noted  . Atrial fibrillation 11/06/2013  . Chronic diastolic heart failure 03/88/8280  . Left ventricular dilatation 11/06/2013  . HTN (hypertension) 11/06/2013  . CKD (chronic kidney disease) stage 3, GFR 30-59 ml/min 11/06/2013  . Retroperitoneal bleeding 11/04/2013  . Acute blood loss anemia 11/04/2013  . Coagulopathy 11/04/2013  . History of pulmonary embolism 11/04/2013  . DVT (deep venous thrombosis) 11/04/2013    Past Medical History:  Past Medical History  Diagnosis Date  . Hypertension   . Dysrhythmia   . Shortness of breath   . GERD (gastroesophageal reflux disease)   . Chronic kidney disease   . Headache(784.0)   . Cancer april 2013    prostate, radiation & chemo  . Arthritis     hips and shoulders  . Acute blood loss anemia 11/04/2013   Past Surgical History:  Past Surgical History  Procedure Laterality Date  . Cardiac catheterization  2007    ablation  . Cardiac catheterization  2013    for chest pain, no CAD noted  . Tonsillectomy      as child    Assessment & Plan HPI: [name] is a 68 y.o. right-handed male with history of atrial fibrillation and pulmonary emboli maintained on Eliquis, chronic renal insufficiency with baseline creatinine 1.4. Patient independent and driving prior to admission. Admitted to [facility] 11/04/2013 with chest and back pain. CTA chest revealed retroperitoneal hemorrhage secondary to anticoagulation. Anti-coagulation held anticipate 3-4 weeks. Lower extremity Doppler showed no signs of DVT. Atrial fibrillation maintained on Tikosyn as well as Toprol. Complaints of bilateral foot pain right greater than left x-ray of right  foot showed no acute osseous injury there was some mild soft tissue swelling over the dorsal aspect of the right foot.  Uric acid level mildly elevated at 8.1. Patient did receive Depo-Medrol injection 11/09/2013 and placed on colchicine 11/10/2013. Physical and occupational therapy evaluation completed 11/07/2013 with recommendations for physical medicine rehabilitation consult to consider inpatient rehabilitation services. Patient was felt to be a good candidate for inpatient rehabilitation services and was admitted for comprehensive rehabilitation program   Clinical Impression:Patient currently requires mod with mobility secondary to muscle weakness.  Prior to hospitalization, patient was independent  with mobility and lived with Spouse in a House home.  Home access is 4Stairs to enter.  Patient will benefit from skilled PT intervention to maximize safe functional mobility for planned discharge home with intermittent assist.  Anticipate patient will benefit from follow up [facility] at discharge.  Skilled Therapeutic Intervention  Session I  1100-1200 60 min  Patient sitting in w/c , wife present but is about to leave the room as she is rushing for work. Patient complains of pain 8/10 in R Knee, states that the pain is constant and increases with movement. W/C propulsion training with min A and verbal cues for technique and maneuvering needed.Patient education on w/c parts and locking mechanisms Transfer to mat with min A , sit <=>supine with mod A due to inability to lift and position R LE, rolling limited by arthritis and pain in B Hips. Sitting EOM ,dynamic balance training, sit to stand with 2 wheel walker with min A ,static standing with increased amount of cues for even  weight distribution, patient is afraid to weight shift to R due to increased pain .  Exercises in sitting to increase active movement in R LE. Gait training with 2 wheel walker 1 x 5 and 1x 7 feet with mod .A and a second person following  with w/c.  Patient reported feeling fatigued ,returned to room, quick release belt on,all needs within reach, Session II 1430-1530 60 min  Patient sitting in w/c with wife present in the room , states that he is willing to do therapy and propels his w/c to the therapy gym with min A and verbal cues for technique. Arm bike 5 min to increase cardio-pulmunary capacity and general conditioning. Transfer to mat with min A ,exercises in sitting to facilitate ROM in R Knee-Quad sets and hamstring curls with orange theraband. Transfer to supine with min a SLR 2 x10 and knee flexion with reduced friction  2 x10. Gait training 2 x 8 feet with mod A Patients's wife present at the part of the session.     PT Evaluation Precautions/Restrictions Precautions Precautions: Fall Restrictions Weight Bearing Restrictions: No General   Vital SignsTherapy Vitals Temp: 98.2 F (36.8 C) Temp src: Oral Pulse Rate: 75 Resp: 18 BP: 139/65 mmHg Patient Position, if appropriate: Sitting Oxygen Therapy SpO2: 94 % O2 Device: None (Room air) Pain Pain Assessment Pain Assessment: No/denies pain Pain Score: 10-Worst pain ever Pain Type: Acute pain Pain Location: Knee Pain Orientation: Right Pain Descriptors / Indicators: Burning Pain Intervention(s): Medication (See eMAR) Home Living/Prior Functioning Home Living Available Help at Discharge: Available PRN/intermittently Type of Home: House Home Access: Stairs to enter Technical brewer of Steps: 4 Entrance Stairs-Rails: Right Home Layout: One level Additional Comments: pt very independent prior to admission; able to drive and babysit grandkids.  Wife is a Marine scientist who works part time  Lives With: Spouse Prior Function Level of Independence: Independent with gait;Independent with basic ADLs;Independent with homemaking with ambulation  Able to Take Stairs?: Reciprically Driving: Yes Vocation: Retired Biomedical scientist: Hotel manager Leisure: Hobbies-yes (Comment) Comments: Fishing ,hiking Vision/Perception  Vision - History Baseline Vision: Bifocals Patient Visual Report: No change from baseline Vision - Assessment Eye Alignment: Within Functional Limits Perception Perception: Within Functional Limits Praxis Praxis: Intact  Cognition Overall Cognitive Status: Impaired/Different from baseline Arousal/Alertness: Awake/alert Orientation Level: Oriented X4 Attention: Focused Focused Attention: Appears intact Memory: Impaired Memory Impairment: Decreased recall of new information;Decreased short term memory Decreased Short Term Memory: Verbal basic;Functional basic Awareness: Appears intact Problem Solving: Impaired Problem Solving Impairment: Verbal basic;Functional basic Executive Function: Organizing Safety/Judgment: Impaired Magazine features editor: Appears Intact Stereognosis: Appears Intact Hot/Cold: Appears Intact Proprioception: Appears Intact Motor  Motor Motor: Within Functional Limits  Mobility   Locomotion  Ambulation Ambulation/Gait Assistance: 3: Mod assist  Trunk/Postural Assessment  Cervical Assessment Cervical Assessment: Within Functional Limits Thoracic Assessment Thoracic Assessment: Within Functional Limits Postural Control Postural Control: Within Functional Limits  Balance Balance Balance Assessed: Yes Static Sitting Balance Static Sitting - Balance Support: No upper extremity supported;Feet supported Static Sitting - Level of Assistance: 6: Modified independent (Device/Increase time) Static Sitting - Comment/# of Minutes: 10 Extremity Assessment  RUE Assessment RUE Assessment: Exceptions to WFL (4/5 strength) LUE Assessment LUE Assessment: Exceptions to [facility] LUE Strength LUE Overall Strength: Within Functional Limits for tasks assessed Left Shoulder Flexion: 4/5 Left Shoulder ABduction: 4/5 Left Elbow Flexion: 4/5 Left Elbow Extension: 4/5 Left  Wrist Flexion: 4/5 Left Wrist Extension: 4/5 RLE AROM (degrees) Overall AROM Right Lower Extremity:  Due to pain Right Knee Extension: 10 Right Knee Flexion: 45    FIM:  FIM - Bed/Chair Transfer Bed/Chair Transfer Assistive Devices: Arm rests Bed/Chair Transfer: 4: Supine > Sit: Min A (steadying Pt. > 75%/lift 1 leg);4: Sit > Supine: Min A (steadying pt. > 75%/lift 1 leg);4: Bed > Chair or W/C: Min A (steadying Pt. > 75%) FIM - Locomotion: Wheelchair Locomotion: Wheelchair: 2: Travels 50 - 149 ft with minimal assistance (Pt.>75%) FIM - Locomotion: Ambulation Locomotion: Ambulation Assistive Devices: Administrator Ambulation/Gait Assistance: 3: Mod assist Locomotion: Ambulation: 1: Travels less than 50 ft with moderate assistance (Pt: 50 - 74%) (7 feet) FIM - Locomotion: Stairs Locomotion: Stairs: 0: Activity did not occur   Refer to Care Plan for Long Term Goals  Recommendations for other services: Neuropsych  Discharge Criteria: Patient will be discharged from PT if patient refuses treatment 3 consecutive times without medical reason, if treatment goals not met, if there is a change in medical status, if patient makes no progress towards goals or if patient is discharged from hospital.  The above assessment, treatment plan, treatment alternatives and goals were discussed and mutually agreed upon: by patient  Guadlupe Spanish 11/11/2013, 3:56 PM

## 2013-11-11 NOTE — Evaluation (Addendum)
Occupational Therapy Assessment and Plan  Patient Details  Name: Jeff Henson MRN: [id number] Date of Birth: 10/23/1946  OT Diagnosis: acute pain, altered mental status, cognitive deficits, muscle weakness (generalized) and swelling of limb Rehab Potential: Rehab Potential: Good ELOS: 7-10   Today's Date: 11/11/2013 Time: 0900-1000; 1300-1330 Time Calculation (min): 60 min; 30 min  Problem List:  Patient Active Problem List   Diagnosis Date Noted  . Atrial fibrillation 11/06/2013  . Chronic diastolic heart failure 42/70/6237  . Left ventricular dilatation 11/06/2013  . HTN (hypertension) 11/06/2013  . CKD (chronic kidney disease) stage 3, GFR 30-59 ml/min 11/06/2013  . Retroperitoneal bleeding 11/04/2013  . Acute blood loss anemia 11/04/2013  . Coagulopathy 11/04/2013  . History of pulmonary embolism 11/04/2013  . DVT (deep venous thrombosis) 11/04/2013    Past Medical History:  Past Medical History  Diagnosis Date  . Hypertension   . Dysrhythmia   . Shortness of breath   . GERD (gastroesophageal reflux disease)   . Chronic kidney disease   . Headache(784.0)   . Cancer april 2013    prostate, radiation & chemo  . Arthritis     hips and shoulders  . Acute blood loss anemia 11/04/2013   Past Surgical History:  Past Surgical History  Procedure Laterality Date  . Cardiac catheterization  2007    ablation  . Cardiac catheterization  2013    for chest pain, no CAD noted  . Tonsillectomy      as child    Assessment & Plan Clinical Impression: Patient is a 68 y.o. year old male with recent admission to the hospital on 11/04/13 with chest and back pain. CTA chest revealed retroperitoneal hemorrhage secondary to anticoagulation. Anti-coagulation held anticipate 3-4 weeks. Lower extremity Doppler showed no signs of DVT. Atrial fibrillation maintained on Tikosyn as well as Toprol. Complaints of bilateral foot pain right greater than left x-ray of right foot showed no  acute osseous injury there was some mild soft tissue swelling over the dorsal aspect of the right foot.  Uric acid level mildly elevated at 8.1. Patient did receive Depo-Medrol injection 11/09/2013 and placed on colchicine 11/10/2013. Physical and occupational therapy evaluation completed 11/07/2013 with recommendations for physical medicine rehabilitation consult to consider inpatient rehabilitation services. Patient was felt to be a good candidate for inpatient rehabilitation services and was admitted for comprehensive rehabilitation program.  Patient transferred to CIR on 11/10/2013 .    Patient currently requires mod with basic self-care skills secondary to muscle weakness and decreased attention, decreased problem solving, decreased safety awareness, decreased memory and delayed processing.  Prior to hospitalization, patient could complete BADLs with independent .  Patient will benefit from skilled intervention to increase independence with basic self-care skills and increase level of independence with iADL prior to discharge home with care partner.  Anticipate patient will require intermittent supervision and follow up outpatient.  OT - End of Session Activity Tolerance: Decreased this session;Tolerates 30+ min activity with multiple rests Endurance Deficit: Yes OT Assessment Rehab Potential: Good OT Patient demonstrates impairments in the following area(s): Balance;Edema;Endurance;Pain;Safety;Cognition OT Basic ADL's Functional Problem(s): Grooming;Bathing;Dressing;Toileting OT Transfers Functional Problem(s): Toilet;Tub/Shower OT Plan OT Intensity: Minimum of 1-2 x/day, 45 to 90 minutes OT Frequency: 5 out of 7 days OT Duration/Estimated Length of Stay: 7-10 OT Treatment/Interventions: Balance/vestibular training;Cognitive remediation/compensation;Discharge planning;DME/adaptive equipment instruction;Psychosocial support;Patient/family education;Pain management;Functional mobility  training;Self Care/advanced ADL retraining;Wheelchair propulsion/positioning;Therapeutic Activities;Therapeutic Exercise;UE/LE Strength taining/ROM   Skilled Therapeutic Intervention Patient seen for initial ADL FIM and evaluation for occupational  therapy this morning.  Patient with decreased cognition.  Wife present.  Shower completed.  Transfers with minimum assistance.  Decreased safety awareness noted.  Patient completes ADLs with moderate assistance to setup/supervision.  Patient with good rehab potential.   Patient seen in PM for occupational therapy session with focus on BUE strengthening and activity tolerance, functional mobility, education on DME, and transfers.  Patient self propels wheelchair from hospital room 3/4 of way to therapy gym while completing topographical orientation task.  Patient able to locate therapy gym without assistance from therapist.  Patient completes wheelchair to/from transfer tub bench transfer with moderate assistance lateral scoot secondary to increased pain in R knee.  R knee red, painful, and warm to touch.  RN alerted and observed.  Patient limited in session secondary to pain.  Patient with good participation.  OT Evaluation Precautions/Restrictions  Precautions Precautions: Fall Restrictions Weight Bearing Restrictions: No General Chart Reviewed: Yes Family/Caregiver Present: Yes Vital Signs   Pain Pain Assessment Pain Assessment: No/denies pain Pain Score: 8  Pain Type: Acute pain Pain Location: Knee Pain Orientation: Right;Left Pain Descriptors / Indicators: Burning;Constant Pain Onset: On-going Patients Stated Pain Goal: 0 Home Living/Prior Functioning Home Living Family/patient expects to be discharged to:: Private residence Living Arrangements: Spouse/significant other Available Help at Discharge: Available PRN/intermittently Type of Home: House Home Access: Stairs to enter Technical brewer of Steps: 4 Entrance Stairs-Rails:  Right Home Layout: One level Additional Comments: pt very independent prior to admission; able to drive and babysit grandkids.  Wife is a Marine scientist who works part time  Lives With: Spouse Prior Function Level of Independence: Independent with gait;Independent with basic ADLs;Independent with homemaking with ambulation  Able to Take Stairs?: Reciprically Driving: Yes Vocation: Retired Biomedical scientist: Engineer, structural Leisure: Hobbies-yes (Comment) Comments: Fishing ,hiking Vision/Perception  Vision - History Baseline Vision: Bifocals Patient Visual Report: No change from baseline Vision - Assessment Eye Alignment: Within Functional Limits Perception Perception: Within Functional Limits Praxis Praxis: Intact  Cognition Overall Cognitive Status: Impaired/Different from baseline Arousal/Alertness: Awake/alert Orientation Level: Oriented X4 Attention: Focused Focused Attention: Appears intact Memory: Impaired Memory Impairment: Decreased recall of new information;Decreased short term memory Decreased Short Term Memory: Verbal basic;Functional basic Awareness: Appears intact Problem Solving: Impaired Problem Solving Impairment: Verbal basic;Functional basic Executive Function: Therapist, sports: Impaired Magazine features editor: Appears Intact Stereognosis: Appears Intact Hot/Cold: Appears Intact Proprioception: Appears Intact Coordination Gross Motor Movements are Fluid and Coordinated: Yes Fine Motor Movements are Fluid and Coordinated: Yes Motor  Motor Motor: Within Functional Limits Mobility  Bed Mobility Bed Mobility: Rolling Right;Rolling Left;Sit to Supine Rolling Right: 4: Min assist Rolling Left: 4: Min assist Supine to Sit: 4: Min assist Supine to Sit: Patient Percentage: 70% Sitting - Scoot to Edge of Bed: 4: Min assist Sit to Sidelying Right: 4: Min assist Scooting to [facility]: 4: Min assist Scooting to [facility]: Patient Percentage:  70% Transfers Sit to Stand: 3: Mod assist  Trunk/Postural Assessment  Cervical Assessment Cervical Assessment: Within Functional Limits Thoracic Assessment Thoracic Assessment: Within Functional Limits Postural Control Postural Control: Within Functional Limits  Balance Balance Balance Assessed: Yes Static Sitting Balance Static Sitting - Balance Support: No upper extremity supported;Feet supported Static Sitting - Level of Assistance: 6: Modified independent (Device/Increase time) Static Sitting - Comment/# of Minutes: 10 Dynamic Sitting Balance Dynamic Sitting - Balance Support: Feet supported;No upper extremity supported Dynamic Sitting - Level of Assistance: 5: Stand by assistance Dynamic Sitting - Balance Activities: Adams;Reaching for objects;Reaching across midline Extremity/Trunk  Assessment RUE Assessment RUE Assessment: Exceptions to WFL (4/5 strength) LUE Assessment LUE Assessment: Exceptions to [facility] LUE Strength LUE Overall Strength: Within Functional Limits for tasks assessed Left Shoulder Flexion: 4/5 Left Shoulder ABduction: 4/5 Left Elbow Flexion: 4/5 Left Elbow Extension: 4/5 Left Wrist Flexion: 4/5 Left Wrist Extension: 4/5  FIM:  FIM - Eating Eating Activity: 7: Complete independence:no helper FIM - Grooming Grooming Steps: Wash, rinse, dry face;Wash, rinse, dry hands;Oral care, brush teeth, clean dentures;Brush, comb hair Grooming: 5: Set-up assist to obtain items (verbal cues) FIM - Bathing Bathing Steps Patient Completed: Chest;Right Arm;Left Arm;Abdomen;Front perineal area;Buttocks;Right upper leg;Left upper leg;Right lower leg (including foot);Left lower leg (including foot) Bathing: 5: Set-up assist to: Obtain items (supervision for safety/verbal cues) FIM - Upper Body Dressing/Undressing Upper body dressing/undressing steps patient completed: Thread/unthread right sleeve of pullover shirt/dresss;Thread/unthread left sleeve of pullover  shirt/dress;Put head through opening of pull over shirt/dress;Pull shirt over trunk Upper body dressing/undressing: 5: Set-up assist to: Obtain clothing/put away FIM - Lower Body Dressing/Undressing Lower body dressing/undressing steps patient completed: Thread/unthread right underwear leg;Pull underwear up/down;Thread/unthread right pants leg;Pull pants up/down Lower body dressing/undressing: 3: Mod-Patient completed 50-74% of tasks FIM - Toileting Toileting: 4: Steadying assist FIM - Control and instrumentation engineer Devices: Bed rails;Arm rests Bed/Chair Transfer: 4: Bed > Chair or W/C: Min A (steadying Pt. > 75%) FIM - Toilet Transfers Toilet Transfers: 0-Activity did not occur FIM - Systems developer Devices: 3-in-1 commode;Grab bars Tub/shower Transfers: 3-Into Tub/Shower: Mod A (lift or lower/lift 2 legs)   Refer to Care Plan for Long Term Goals  Recommendations for other services: None  Discharge Criteria: Patient will be discharged from OT if patient refuses treatment 3 consecutive times without medical reason, if treatment goals not met, if there is a change in medical status, if patient makes no progress towards goals or if patient is discharged from hospital.  The above assessment, treatment plan, treatment alternatives and goals were discussed and mutually agreed upon: by patient and by family  Osa Craver 11/11/2013, 12:51 PM

## 2013-11-12 ENCOUNTER — Inpatient Hospital Stay (HOSPITAL_COMMUNITY): Payer: Medicare Other | Admitting: *Deleted

## 2013-11-12 DIAGNOSIS — I10 Essential (primary) hypertension: Secondary | ICD-10-CM

## 2013-11-12 DIAGNOSIS — M79609 Pain in unspecified limb: Secondary | ICD-10-CM

## 2013-11-12 DIAGNOSIS — I5032 Chronic diastolic (congestive) heart failure: Secondary | ICD-10-CM

## 2013-11-12 LAB — CBC WITH DIFFERENTIAL/PLATELET
Basophils Absolute: 0 10*3/uL (ref 0.0–0.1)
Basophils Relative: 0 % (ref 0–1)
EOS PCT: 5 % (ref 0–5)
Eosinophils Absolute: 0.5 10*3/uL (ref 0.0–0.7)
HEMATOCRIT: 33.1 % — AB (ref 39.0–52.0)
Hemoglobin: 11 g/dL — ABNORMAL LOW (ref 13.0–17.0)
LYMPHS PCT: 19 % (ref 12–46)
Lymphs Abs: 1.8 10*3/uL (ref 0.7–4.0)
MCH: 33.1 pg (ref 26.0–34.0)
MCHC: 33.2 g/dL (ref 30.0–36.0)
MCV: 99.7 fL (ref 78.0–100.0)
MONO ABS: 0.5 10*3/uL (ref 0.1–1.0)
Monocytes Relative: 5 % (ref 3–12)
Neutro Abs: 6.5 10*3/uL (ref 1.7–7.7)
Neutrophils Relative %: 70 % (ref 43–77)
PLATELETS: 287 10*3/uL (ref 150–400)
RBC: 3.32 MIL/uL — ABNORMAL LOW (ref 4.22–5.81)
RDW: 13.4 % (ref 11.5–15.5)
WBC: 9.2 10*3/uL (ref 4.0–10.5)

## 2013-11-12 LAB — BASIC METABOLIC PANEL
BUN: 34 mg/dL — ABNORMAL HIGH (ref 6–23)
CALCIUM: 8.7 mg/dL (ref 8.4–10.5)
CO2: 33 meq/L — AB (ref 19–32)
CREATININE: 1.53 mg/dL — AB (ref 0.50–1.35)
Chloride: 100 mEq/L (ref 96–112)
GFR calc Af Amer: 52 mL/min — ABNORMAL LOW (ref >90)
GFR, EST NON AFRICAN AMERICAN: 45 mL/min — AB (ref >90)
Glucose, Bld: 124 mg/dL — ABNORMAL HIGH (ref 70–99)
Potassium: 4.6 mEq/L (ref 3.7–5.3)
SODIUM: 143 meq/L (ref 137–147)

## 2013-11-12 MED ORDER — ZOLPIDEM TARTRATE 5 MG PO TABS: 5.0000 mg | ORAL_TABLET | Freq: Every evening | ORAL | Status: DC | PRN

## 2013-11-12 NOTE — Progress Notes (Signed)
Bilateral lower extremity venous duplex completed.  Right:  DVT noted in the peroneal vein.  No evidence of superficial thrombosis.  No Baker's cyst.  Left:  No evidence of DVT, superficial thrombosis, or Baker's cyst.   

## 2013-11-12 NOTE — Progress Notes (Signed)
Physical Therapy Note  Patient Details  Name: Jeff Henson MRN: [id number] Date of Birth: [birth year] Today's Date: 11/12/2013  Patient in bed,reffuses therapy intervention due to scheduled Doppler study. Patient stated that he does not want to risk"throwing" a clot therefore he will not participate.  Patient has been educated on need to continue therapy and benefits it brings to his functional level.  Nursing notified.  Guadlupe Spanish 11/12/2013, 12:27 PM

## 2013-11-12 NOTE — Progress Notes (Signed)
Pt planned to go down for IVC filter placement in morning as ordered due to new DVT in RLE.  Orders NPO after midnight, IV site in place, CDI, and orders inplace for labs to be drawn. Pt and wife aware and understands procedure with no further questions at this time. Pt in bed resting with call bell in reach and pain managed.  Pt unable to void with retaining >500cc in bladder twice on shift. Pt I&O cath for high volumes. Pt states he has self cathed at home for awhile and is aware of management. Will discuss plan of care throughout week.

## 2013-11-12 NOTE — Progress Notes (Signed)
Patient ID: Bartley Vuolo, male   DOB: 04-Nov-1946, 68 y.o.   MRN: [id number]  Patient ID: Ahijah Devery, male   DOB: 03-Nov-1946, 68 y.o.   MRN: [id number]  28/49.  68 year old patient who is admitted to the rehabilitation service for management of deconditioning following a retroperitoneal bleed and secondary anemia. Patient has a history of atrial fibrillation and prior pulmonary emboli and DVT treated with Eliquis. Additional medical problems include chronic diastolic heart failure hypertension and chronic kidney disease.  For the past 24 hours he has had increasing pain and swelling involving his right leg.  He states that he has had recurrent DVT and pulmonary emboli while on Coumadin anticoagulation and had an allergic reaction to Xarelto.  Eliquis has been on hold since his retroperitoneal hemorrhage with plans to hold for 4 weeks. He also complains of poor sleep. His rash involving his back is much improved. He required an in and out catheterization today for urinary retention with a residual volume of 650 cc.  He is also concerned that multiple preadmission medications have been on hold.  Lower and extremity venous Doppler study is normal on December 27.  Patient Vitals for the past 24 hrs:  BP Temp Temp src Pulse Resp SpO2  11/12/13 0445 155/79 mmHg 97.9 F (36.6 C) Oral 66 19 93 %  11/11/13 1548 139/65 mmHg 98.2 F (36.8 C) Oral 75 18 94 %     Intake/Output Summary (Last 24 hours) at 11/12/13 9518 Last data filed at 11/12/13 0900  Gross per 24 hour  Intake    840 ml  Output    650 ml  Net    190 ml    Past Medical History  Diagnosis Date  . Hypertension   . Dysrhythmia   . Shortness of breath   . GERD (gastroesophageal reflux disease)   . Chronic kidney disease   . Headache(784.0)   . Cancer april 2013    prostate, radiation & chemo  . Arthritis     hips and shoulders  . Acute blood loss anemia 11/04/2013       Past Surgical History  Procedure Laterality Date  .  Cardiac catheterization  2007    ablation  . Cardiac catheterization  2013    for chest pain, no CAD noted  . Tonsillectomy      as child    Current facility-administered medications:acetaminophen (TYLENOL) tablet 325-650 mg, 325-650 mg, Oral, Q4H PRN, Lavon Paganini Angiulli, PA-C, 650 mg at 11/11/13 1921;  antiseptic oral rinse (BIOTENE) solution 15 mL, 15 mL, Mouth Rinse, BID, Lavon Paganini Angiulli, PA-C, 15 mL at 11/11/13 0757;  colchicine tablet 0.6 mg, 0.6 mg, Oral, Daily, Lavon Paganini Angiulli, PA-C, 0.6 mg at 11/12/13 8416 dofetilide (TIKOSYN) capsule 250 mcg, 250 mcg, Oral, BID, Lavon Paganini Angiulli, PA-C, 250 mcg at 11/12/13 6063;  hydrocortisone cream 1 % 1 application, 1 application, Topical, TID PRN, Marletta Lor, MD, 1 application at 01/60/10 0425;  metoprolol succinate (TOPROL-XL) 24 hr tablet 200 mg, 200 mg, Oral, Daily, Lavon Paganini Angiulli, PA-C, 200 mg at 11/12/13 0916 ondansetron (ZOFRAN) injection 4 mg, 4 mg, Intravenous, Q6H PRN, Lavon Paganini Angiulli, PA-C;  ondansetron (ZOFRAN) tablet 4 mg, 4 mg, Oral, Q6H PRN, Lavon Paganini Angiulli, PA-C;  sorbitol 70 % solution 30 mL, 30 mL, Oral, Daily PRN, Lavon Paganini Angiulli, PA-C;  traMADol Veatrice Bourbon) tablet 50 mg, 50 mg, Oral, Q6H PRN, Lavon Paganini Angiulli, PA-C, 50 mg at 11/12/13 0435  Physical Exam  Constitutional: He is  oriented to person, place, and time.  HENT:  Head: Normocephalic.  Eyes: EOM are normal.  Neck: Normal range of motion. Neck supple. No thyromegaly present.  Cardiovascular:  Cardiac rate controlled  Respiratory: Effort normal and breath sounds normal. No respiratory distress.  GI: Soft. Bowel sounds are normal. He exhibits no distension. There is no tenderness.  Musculoskeletal:   swelling noted involving the right leg extending to the foot and ankle. Knee and calf tenderness present  Neurological: He is alert and oriented to person, place, and time. No sensory deficit.  Strength is 5/5 in bilateral deltoid, bicep, tricep, grip 3  minus/5 Right knee extensor, 2 minus in the ankle dorsiflexor and plantar flexor, 2 minus in the hip flexor all limited by pain, Left HF,KE,ADF 4/5  Skin: Pruritic erythematous papular rash involving the upper and mid back area -Improved  Medical Problem List and Plan: Improved 1. Deconditioning after a retroperitoneal hematoma felt to be secondary to anti-coagulation.Eliquis to be held 3-4 weeks  2. DVT Prophylaxis/Anticoagulation: SCDs. Recent venous Doppler studies negative  3. Pain Management: Ultram as needed. Monitor with increased mobility  4. Neuropsych: This patient is capable of making decisions on his own behalf.  5. Suspect gout right lower extremity. Uric acid level 8.1. Received Depo-Medrol injection times one. Placed on colchicine 11/10/2013  6. Chronic renal insufficiency. Baseline creatinine 1 point and monitor. Repeat chemistries  7. Atrial fibrillation. Continue Tikosyn 250 mcg twice a day, Toprol-XL 200 mg daily. Again Eliquis to be held 3-4 weeks secondary to retroperitoneal hematoma. Cardiac rate remains stable  8. HTN-well-controlled on present therapy  9. rule out right leg DVT.   We'll check a venous Doppler study and if positive as suspected  will ask interventional radiology to consider an IVC filter  10. Insomnia. Will place a new order for Ambien  11.  Urinary retention. Will continue in and out catheterization as needed

## 2013-11-12 NOTE — Progress Notes (Signed)
Patient OOB to stand at bedside to void.  Patient unable to void.  Bladder scan 636 ml.  Last void noted to be 11/11/13 at 1852.  In and out cath performed for 650 ml clear, amber urine.  Patient tolerated well.

## 2013-11-13 ENCOUNTER — Inpatient Hospital Stay (HOSPITAL_COMMUNITY): Payer: Medicare Other

## 2013-11-13 ENCOUNTER — Inpatient Hospital Stay (HOSPITAL_COMMUNITY): Payer: Federal, State, Local not specified - PPO

## 2013-11-13 ENCOUNTER — Inpatient Hospital Stay (HOSPITAL_COMMUNITY): Payer: Medicare Other | Admitting: Physical Therapy

## 2013-11-13 DIAGNOSIS — M109 Gout, unspecified: Secondary | ICD-10-CM

## 2013-11-13 DIAGNOSIS — R5381 Other malaise: Secondary | ICD-10-CM

## 2013-11-13 LAB — COMPREHENSIVE METABOLIC PANEL
ALBUMIN: 2.8 g/dL — AB (ref 3.5–5.2)
ALT: 136 U/L — AB (ref 0–53)
AST: 78 U/L — AB (ref 0–37)
Alkaline Phosphatase: 169 U/L — ABNORMAL HIGH (ref 39–117)
BUN: 29 mg/dL — ABNORMAL HIGH (ref 6–23)
CO2: 28 mEq/L (ref 19–32)
Calcium: 8.8 mg/dL (ref 8.4–10.5)
Chloride: 101 mEq/L (ref 96–112)
Creatinine, Ser: 1.48 mg/dL — ABNORMAL HIGH (ref 0.50–1.35)
GFR calc Af Amer: 54 mL/min — ABNORMAL LOW (ref >90)
GFR calc non Af Amer: 47 mL/min — ABNORMAL LOW (ref >90)
Glucose, Bld: 104 mg/dL — ABNORMAL HIGH (ref 70–99)
POTASSIUM: 4.9 meq/L (ref 3.7–5.3)
SODIUM: 141 meq/L (ref 137–147)
TOTAL PROTEIN: 6.9 g/dL (ref 6.0–8.3)
Total Bilirubin: 1 mg/dL (ref 0.3–1.2)

## 2013-11-13 LAB — CBC WITH DIFFERENTIAL/PLATELET
BASOS ABS: 0 10*3/uL (ref 0.0–0.1)
Basophils Relative: 0 % (ref 0–1)
EOS ABS: 0.5 10*3/uL (ref 0.0–0.7)
Eosinophils Relative: 6 % — ABNORMAL HIGH (ref 0–5)
HCT: 33.6 % — ABNORMAL LOW (ref 39.0–52.0)
Hemoglobin: 11.1 g/dL — ABNORMAL LOW (ref 13.0–17.0)
Lymphocytes Relative: 8 % — ABNORMAL LOW (ref 12–46)
Lymphs Abs: 0.8 10*3/uL (ref 0.7–4.0)
MCH: 32.5 pg (ref 26.0–34.0)
MCHC: 33 g/dL (ref 30.0–36.0)
MCV: 98.2 fL (ref 78.0–100.0)
Monocytes Absolute: 1.6 10*3/uL — ABNORMAL HIGH (ref 0.1–1.0)
Monocytes Relative: 17 % — ABNORMAL HIGH (ref 3–12)
NEUTROS PCT: 69 % (ref 43–77)
Neutro Abs: 6.6 10*3/uL (ref 1.7–7.7)
PLATELETS: 330 10*3/uL (ref 150–400)
RBC: 3.42 MIL/uL — ABNORMAL LOW (ref 4.22–5.81)
RDW: 13.1 % (ref 11.5–15.5)
WBC: 9.5 10*3/uL (ref 4.0–10.5)

## 2013-11-13 NOTE — Progress Notes (Signed)
Social Work Assessment and Plan Social Work Assessment and Plan  Patient Details  Name: Jeff Henson MRN: [id number] Date of Birth: 1946/02/17  Today's Date: 11/13/2013  Problem List:  Patient Active Problem List   Diagnosis Date Noted  . Atrial fibrillation 11/06/2013  . Chronic diastolic heart failure 62/70/3500  . Left ventricular dilatation 11/06/2013  . HTN (hypertension) 11/06/2013  . CKD (chronic kidney disease) stage 3, GFR 30-59 ml/min 11/06/2013  . Retroperitoneal bleeding 11/04/2013  . Acute blood loss anemia 11/04/2013  . Coagulopathy 11/04/2013  . History of pulmonary embolism 11/04/2013  . DVT (deep venous thrombosis) 11/04/2013   Past Medical History:  Past Medical History  Diagnosis Date  . Hypertension   . Dysrhythmia   . Shortness of breath   . GERD (gastroesophageal reflux disease)   . Chronic kidney disease   . Headache(784.0)   . Cancer april 2013    prostate, radiation & chemo  . Arthritis     hips and shoulders  . Acute blood loss anemia 11/04/2013   Past Surgical History:  Past Surgical History  Procedure Laterality Date  . Cardiac catheterization  2007    ablation  . Cardiac catheterization  2013    for chest pain, no CAD noted  . Tonsillectomy      as child   Social History:  reports that he quit smoking about 30 years ago. His smoking use included Pipe and Cigars. He does not have any smokeless tobacco history on file. He reports that he does not drink alcohol or use illicit drugs.  Family / Support Systems Marital Status: Married Patient Roles: Spouse;Parent Spouse/Significant Other: Mary [phone]  [phone] Children: Marisue [place] [phone] Other Supports: Two son's also local Anticipated Caregiver: Wife Ability/Limitations of Caregiver: Wife works part time Wed-pm, Friday and every other Sat Caregiver Availability: Other (Comment) (See above-will work on more if necessary) Family Dynamics: Close knit  family who are close and willing to help one another.  Wife is here and supportive of pt, but also expects him to do well here.  Social History Preferred language: English Religion:  Cultural Background: No issues Education: Secretary/administrator Educated Read: Yes Write: Yes Employment Status: Retired Freight forwarder Issues: No issues Guardian/Conservator: None-according to MD pt is capable of making his own decisions while here.  Wife is involved also.   Abuse/Neglect Physical Abuse: Denies Verbal Abuse: Denies Sexual Abuse: Denies Exploitation of patient/patient's resources: Denies Self-Neglect: Denies  Emotional Status Pt's affect, behavior adn adjustment status: Pt is motivated to improve and wants to regain his independence before he leaves here.  He feels he needs to get his strength back this is what he lost while having medical issues.  Wife is having him pay bills to see how he does today. Recent Psychosocial Issues: Other medical issues but felt these were managed and he was doing well. Pyschiatric History: No history-deferred depression screen due to pt feels he si doing well and this is not necessary.  Will monitor his coping and interevene or have Neuropsych see if needed. Substance Abuse History: No issues  Patient / Family Perceptions, Expectations & Goals Pt/Family understanding of illness & functional limitations: Pt and wife have a good understanding of his condition.  Both feel he is doing well and will be here a short time before going home.  Wife is a Therapist, sports and has a good understanding of his condition.  She also speaks with MD daily to check on his condition Premorbid pt/family roles/activities: HUsband, father,  Grandfather, retiree, Health visitor, Etc Anticipated changes in roles/activities/participation: Plans to resume at discharge Pt/family expectations/goals: Pt states: " I could go home now, but need to be stronger."  Wife states: " He was independent before this  and would like him to get back to this."  [employer]: None Premorbid Home Care/DME Agencies: None Transportation available at discharge: Wife  Discharge Planning Living Arrangements: Spouse/significant other Support Systems: Spouse/significant other;Children;Friends/neighbors;Church/faith community Type of Residence: Private residence Insurance underwriter Resources: Education officer, museum (specify) Nurse, mental health) Financial Resources: Fish farm manager;Family Support Financial Screen Referred: No Living Expenses: Lives with family Money Management: Spouse;Patient Does the patient have any problems obtaining your medications?: No Home Management: Wife Patient/Family Preliminary Plans: Return home with wife who can provide almost 24 hr supervision level-she does work 10 hours per week.  Their daughter is willing to help also.  Both hope he will do better than supervision level at discharge. Social Work Anticipated Follow Up Needs: HH/OP  Clinical Impression Pleasant gentleman who is sitting paying monthly bills-wife is here and supportive and is testing pt, to see how accurate he is.  Both are hopeful he will do well here and return back To his prior functioning.  Wife aware he may require supervision at discharge.  Someone is always here with him-between wife and daughter.  [name] 11/13/2013, 10:46 AM

## 2013-11-13 NOTE — Progress Notes (Signed)
Occupational Therapy Note  Patient Details  Name: Eldo Umanzor MRN: [id number] Date of Birth: 12/27/1945 Today's Date: 11/13/2013  Patient missing 60 min skilled OT therapy at this time secondary to patient being on hold. RN, Pryor Montes, informed therapist of this upon arrival as there was no MD order in chart. RN reported that MD order is in the process of being placed. Patient and his wife are aware of MD hold.    Oreatha Fabry N 11/13/2013, 8:38 AM

## 2013-11-13 NOTE — Care Management Note (Signed)
[facility] [place] Individual Statement of Services  Patient Name:  Jeff Henson  Date:  11/13/2013  Welcome to the [facility].  Our goal is to provide you with an individualized program based on your diagnosis and situation, designed to meet your specific needs.  With this comprehensive rehabilitation program, you will be expected to participate in at least 3 hours of rehabilitation therapies Monday-Friday, with modified therapy programming on the weekends.  Your rehabilitation program will include the following services:  Physical Therapy (PT), Occupational Therapy (OT), 24 hour per day rehabilitation nursing, Case Management (Social Worker), Rehabilitation Medicine, Nutrition Services and Pharmacy Services  Weekly team conferences will be held on Wednesday to discuss your progress.  Your Social Worker will talk with you frequently to get your input and to update you on team discussions.  Team conferences with you and your family in attendance may also be held.  Expected length of stay: 10-14 days  Overall anticipated outcome; Supervision with cues:   Depending on your progress and recovery, your program may change. Your Social Worker will coordinate services and will keep you informed of any changes. Your Social Worker's name and contact numbers are listed  below.  The following services may also be recommended but are not provided by the [facility] will be made to provide these services after discharge if needed.  Arrangements include referral to agencies that provide these services.  Your insurance has been verified to be:  [place] Your primary doctor is:  Dr. [facility]  Pertinent information will be shared with your doctor and your insurance company.  Social Worker:  Ovidio Kin, [place] or ([phone]  Information discussed with and copy given to patient by: [name], 11/13/2013, 10:33 AM

## 2013-11-13 NOTE — Progress Notes (Signed)
Patient information reviewed and entered into eRehab system by Haseeb Fiallos, RN, CRRN, PPS Coordinator.  Information including medical coding and functional independence measure will be reviewed and updated through discharge.     Per nursing patient was given "Data Collection Information Summary for Patients in Inpatient Rehabilitation Facilities with attached "Privacy Act Statement-Health Care Records" upon admission.  

## 2013-11-13 NOTE — Progress Notes (Addendum)
Physical Therapy Session Note  Patient Details  Name: Jeff Henson MRN: [id number] Date of Birth: 1946-08-10  Today's Date: 11/13/2013 Time: 0347-4259 and 1330-1415 Time Calculation (min): 45 min and 45 min  Short Term Goals: Week 1:  PT Short Term Goal 1 (Week 1): STG =LTG  Skilled Therapeutic Interventions/Progress Updates:    Treatment Session 1: Pt received semi-reclined in bed with wife present. Pt reports R LE pain but is agreeable to therapy. Session performed bedside, per MD order, and focused on initiation of HEP for bilat LE strengthening within pain-free parameters. See below fr detailed description of exercises/cueing.  Therapist departed with pt semi-reclined in bed with 3 bed rails up, bed alarm on, wife present and all needs within reach.  Pt performed the following exercises semi-reclined in bed: ankle plantarflexion/dorsiflexion AROM x20 reps on L, 2x5 reps on R; heel slides x20 reps on L, x10 reps on R; L SAQ x15 reps (2-sec holds), R quad set x5 reps (2-sec holds), bilat hip abduction pillow squeeze x10 reps (3-sec holds). The following exercises were performed in supine: L single leg bridging 2x5 reps; L hip abduction x15 reps (in supine to eliminate compensation with L tensor fascia latae in semi-reclined). Tactile and verbal cues provided throughout to facilitate proper technique; pt with effective return demonstration. Paper handout provided to promote carryover.  Pt instructed to perform exercises within pain-free ROM.  Treatment Session 2:  Pt received seated in bedside chair; agreeable to therapy. Session focused on gait, transfers, stair negotiation, as MD orders for bedside therapy were discontinued. Performed stand pivot transfers (bedside chair<>w/c<>mat table) with rolling walker and supervision, cueing for hand placement, setup, and safety. Educated pt on w/c mobility, management of w/c brakes and leg rests with effective within-session carryover. Performed w/c  propulsion 2x75' in controlled environment with supervision-min for technique. Gait x40' in controlled environment with rolling walker, min guard. Gait trial ended secondary to RLE feeling "tired," per pt. Negotiation of 5 stairs with bilat rails, step-to pattern; min A to ascend, mod A to descend, and verbal cueing for sequencing. Session ended in pt room, where pt was left seated on toilet with RN present and all needs within reach.   Long term goals added to address the following: bed mobility, dynamic standing balance, car transfers, furniture transfers, and ambulation in community environment.  Long term goals addressing the following were upgraded (secondary to pt progressing quickly): bed<>chair transfer, ambulation in home and controlled environments. Long term goal for stair negotiation modified to 4 steps without rails to simulate primary entrance of pt's home.  Therapy Documentation Precautions:  Precautions Precautions: Fall Restrictions Weight Bearing Restrictions: No Pain: Pain Assessment Pain Assessment: 0-10 Pain Score: 7  Pain Type: Acute pain Pain Location: Leg Pain Orientation: Right Pain Descriptors / Indicators: Aching;Constant Pain Onset: Gradual Pain Intervention(s): Medication (See eMAR) Multiple Pain Sites: No  See FIM for current functional status  Therapy/Group: Individual Therapy  [place]obble, Malva Cogan 11/13/2013, 12:12 PM

## 2013-11-13 NOTE — Progress Notes (Signed)
Occupational Therapy Session Note  Patient Details  Name: Jeff Henson MRN: [id number] Date of Birth: Dec 15, 1945  Today's Date: 11/13/2013 Time: 1045-1130 Time Calculation (min): 45 min  Short Term Goals: Week 1:  OT Short Term Goal 1 (Week 1): Patient will complete wheelchair to toilet transfer with supervision. OT Short Term Goal 2 (Week 1): Patient will complete toileting with supervision. OT Short Term Goal 3 (Week 1): Patient will complete upper body dressing independently. OT Short Term Goal 4 (Week 1): Patient will complete lower body dressing with supervision. OT Short Term Goal 5 (Week 1): Patient will complete shower transfers with supervision.  Skilled Therapeutic Interventions/Progress Updates:    Pt received supine in bed with orders still for therapy from bed level. Began completing UE theraband exercises 1 set x20 reps. RN Pryor Montes entered room and reported orders were removed from bed level therapy and encouraging pt get out of bed today. Pt agreeable. Required min assist for supine>sit with management of RLE. Completed stand pivot transfer with min assist and cues for weight shifting through RLE. Pt propelled self to ADL apartment for BUE strengthening and activity tolerance with 3 rest breaks and cues for hand placement. Ambulated approx 10 feet with RW at min assist to complete tub transfer using TTB. Pt required min assist to manage RLE into tub. Pt concerned about inability to lift leg over tub and discussed option of leg lifter however encouraged pt to give it a couple days before relying on AE. Pt completed stand pivot transfer TTB>w/c with min assist and therapist propelled pt in w/c back to room. Had discussion with patient and wife and both reporting no cognitive deficits at this time. Pt appeared to have good safety awareness during therapy session. Wife reported pt had cognitive deficits upon transition to CIR secondary to medications. Pt left in w/c with wife in room and  trialing no quick release belt. Pt and wife with no questions or concerns at this time.   Therapy Documentation Precautions:  Precautions Precautions: Fall Restrictions Weight Bearing Restrictions: No General: General Amount of Missed OT Time (min): 60 Minutes Vital Signs:   Pain: Pt with pain in RLE reporting 6/10 rating.   See FIM for current functional status  Therapy/Group: Individual Therapy  Duayne Cal 11/13/2013, 12:04 PM

## 2013-11-13 NOTE — Progress Notes (Signed)
Patient refusing sleep aid at this time due to coughing.  States, "I've been coughing about 2 weeks now."

## 2013-11-13 NOTE — Progress Notes (Signed)
Subjective/Complaints: No chief complaint on file.  :  Chief complaint: Right foot pain  HPI: Jeff Henson is a 68 y.o. right-handed male with history of atrial fibrillation and pulmonary emboli maintained on Eliquis, chronic renal insufficiency with baseline creatinine 1.4. Patient independent and driving prior to admission. Admitted to [facility] 11/04/2013 with chest and back pain. CTA chest revealed retroperitoneal hemorrhage secondary to anticoagulation. Anti-coagulation held anticipate 3-4 weeks. Lower extremity Doppler showed no signs of DVT. Atrial fibrillation maintained on Tikosyn as well as Toprol. Complaints of bilateral foot pain right greater than left x-ray of right foot showed no acute osseous injury there was some mild soft tissue swelling over the dorsal aspect of the right foot.  Uric acid level mildly elevated at 8.1. Patient did receive Depo-Medrol injection 11/09/2013 and placed on colchicine 11/10/2013. Physical and occupational therapy evaluation completed 11/07/2013 with recommendations for physical medicine rehabilitation consult to consider inpatient rehabilitation services. Patient was felt to be a good candidate for inpatient rehabilitation services and was admitted for comprehensive rehabilitation program   Doppler unofficial report revealed R peroneal DVT, radiology report pending Has calf pain, no thigh or knee pain , no SOB Objective: Vital Signs: Blood pressure 147/84, pulse 90, temperature 97.9 F (36.6 C), temperature source Oral, resp. rate 17, SpO2 93.00%. No results found. Results for orders placed during the hospital encounter of 11/10/13 (from the past 72 hour(s))  CBC WITH DIFFERENTIAL     Status: Abnormal   Collection Time    11/12/13  3:18 PM      Result Value Range   WBC 9.2  4.0 - 10.5 K/uL   RBC 3.32 (*) 4.22 - 5.81 MIL/uL   Hemoglobin 11.0 (*) 13.0 - 17.0 g/dL   HCT 33.1 (*) 39.0 - 52.0 %   MCV 99.7  78.0 - 100.0 fL   MCH 33.1  26.0 - 34.0 pg   MCHC 33.2  30.0 - 36.0 g/dL   RDW 13.4  11.5 - 15.5 %   Platelets 287  150 - 400 K/uL   Neutrophils Relative % 70  43 - 77 %   Neutro Abs 6.5  1.7 - 7.7 K/uL   Lymphocytes Relative 19  12 - 46 %   Lymphs Abs 1.8  0.7 - 4.0 K/uL   Monocytes Relative 5  3 - 12 %   Monocytes Absolute 0.5  0.1 - 1.0 K/uL   Eosinophils Relative 5  0 - 5 %   Eosinophils Absolute 0.5  0.0 - 0.7 K/uL   Basophils Relative 0  0 - 1 %   Basophils Absolute 0.0  0.0 - 0.1 K/uL  BASIC METABOLIC PANEL     Status: Abnormal   Collection Time    11/12/13  3:18 PM      Result Value Range   Sodium 143  137 - 147 mEq/L   Comment: Please note change in reference range.   Potassium 4.6  3.7 - 5.3 mEq/L   Comment: Please note change in reference range.   Chloride 100  96 - 112 mEq/L   CO2 33 (*) 19 - 32 mEq/L   Glucose, Bld 124 (*) 70 - 99 mg/dL   BUN 34 (*) 6 - 23 mg/dL   Creatinine, Ser 1.53 (*) 0.50 - 1.35 mg/dL   Calcium 8.7  8.4 - 10.5 mg/dL   GFR calc non Af Amer 45 (*) >90 mL/min   GFR calc Af Amer 52 (*) >90 mL/min   Comment: (NOTE)  The eGFR has been calculated using the CKD EPI equation.     This calculation has not been validated in all clinical situations.     eGFR's persistently <90 mL/min signify possible Chronic Kidney     Disease.  CBC WITH DIFFERENTIAL     Status: Abnormal   Collection Time    11/13/13  5:50 AM      Result Value Range   WBC 9.5  4.0 - 10.5 K/uL   RBC 3.42 (*) 4.22 - 5.81 MIL/uL   Hemoglobin 11.1 (*) 13.0 - 17.0 g/dL   HCT 33.6 (*) 39.0 - 52.0 %   MCV 98.2  78.0 - 100.0 fL   MCH 32.5  26.0 - 34.0 pg   MCHC 33.0  30.0 - 36.0 g/dL   RDW 13.1  11.5 - 15.5 %   Platelets 330  150 - 400 K/uL   Neutrophils Relative % 69  43 - 77 %   Neutro Abs 6.6  1.7 - 7.7 K/uL   Lymphocytes Relative 8 (*) 12 - 46 %   Lymphs Abs 0.8  0.7 - 4.0 K/uL   Monocytes Relative 17 (*) 3 - 12 %   Monocytes Absolute 1.6 (*) 0.1 - 1.0 K/uL   Eosinophils Relative 6 (*) 0 - 5  %   Eosinophils Absolute 0.5  0.0 - 0.7 K/uL   Basophils Relative 0  0 - 1 %   Basophils Absolute 0.0  0.0 - 0.1 K/uL  COMPREHENSIVE METABOLIC PANEL     Status: Abnormal   Collection Time    11/13/13  5:50 AM      Result Value Range   Sodium 141  137 - 147 mEq/L   Comment: Please note change in reference range.   Potassium 4.9  3.7 - 5.3 mEq/L   Comment: Please note change in reference range.   Chloride 101  96 - 112 mEq/L   CO2 28  19 - 32 mEq/L   Glucose, Bld 104 (*) 70 - 99 mg/dL   BUN 29 (*) 6 - 23 mg/dL   Creatinine, Ser 1.48 (*) 0.50 - 1.35 mg/dL   Calcium 8.8  8.4 - 10.5 mg/dL   Total Protein 6.9  6.0 - 8.3 g/dL   Albumin 2.8 (*) 3.5 - 5.2 g/dL   AST 78 (*) 0 - 37 U/L   ALT 136 (*) 0 - 53 U/L   Alkaline Phosphatase 169 (*) 39 - 117 U/L   Total Bilirubin 1.0  0.3 - 1.2 mg/dL   GFR calc non Af Amer 47 (*) >90 mL/min   GFR calc Af Amer 54 (*) >90 mL/min   Comment: (NOTE)     The eGFR has been calculated using the CKD EPI equation.     This calculation has not been validated in all clinical situations.     eGFR's persistently <90 mL/min signify possible Chronic Kidney     Disease.      Physical Exam  Constitutional: He is oriented to person, place, and time.  HENT:  Head: Normocephalic.  Eyes: EOM are normal.  Neck: Normal range of motion. Neck supple. No thyromegaly present.  Cardiovascular:  Cardiac rate controlled  Respiratory: Effort normal and breath sounds normal. No respiratory distress.  GI: Soft. Bowel sounds are normal. He exhibits no distension. There is no tenderness.  Musculoskeletal:  Right foot and dorsal tenderness with palpable pedal pulses Swelling R>L foot and ankle, tenderness with right great toe ROM   Mild effusion R knee  pain with AROM/PROM Mild pain but no swelling L knee  Neurological: He is alert and oriented to person, place, and time. No sensory deficit.  Strength is 5/5 in bilateral deltoid, bicep, tricep, grip 3 minus/5 Right knee  extensor, 2 minus in the ankle dorsiflexor and plantar flexor, 2 minus in the hip flexor all limited by pain, Left HF,KE,ADF 4/5  Skin: Skin is warm and dry       Assessment/Plan: 1. Functional deficits secondary to deconditioning retroperitoneal hematoma which require 3+ hours per day of interdisciplinary therapy in a comprehensive inpatient rehab setting. Physiatrist is providing close team supervision and 24 hour management of active medical problems listed below. Physiatrist and rehab team continue to assess barriers to discharge/monitor patient progress toward functional and medical goals. FIM: FIM - Bathing Bathing Steps Patient Completed: Chest;Right Arm;Left Arm;Abdomen;Front perineal area;Buttocks;Right upper leg;Left upper leg;Right lower leg (including foot);Left lower leg (including foot) Bathing: 5: Set-up assist to: Obtain items (supervision for safety/verbal cues)  FIM - Upper Body Dressing/Undressing Upper body dressing/undressing steps patient completed: Thread/unthread right sleeve of pullover shirt/dresss;Thread/unthread left sleeve of pullover shirt/dress;Put head through opening of pull over shirt/dress;Pull shirt over trunk Upper body dressing/undressing: 5: Set-up assist to: Obtain clothing/put away FIM - Lower Body Dressing/Undressing Lower body dressing/undressing steps patient completed: Thread/unthread right underwear leg;Pull underwear up/down;Thread/unthread right pants leg;Pull pants up/down Lower body dressing/undressing: 3: Mod-Patient completed 50-74% of tasks  FIM - Toileting Toileting steps completed by patient: Adjust clothing prior to toileting;Performs perineal hygiene;Adjust clothing after toileting Toileting Assistive Devices: Grab bar or rail for support Toileting: 4: Steadying assist  FIM - Air cabin crew Transfers: 0-Activity did not occur  FIM - Control and instrumentation engineer Devices: Arm rests Bed/Chair Transfer: 4:  Supine > Sit: Min A (steadying Pt. > 75%/lift 1 leg);4: Sit > Supine: Min A (steadying pt. > 75%/lift 1 leg);4: Bed > Chair or W/C: Min A (steadying Pt. > 75%)  FIM - Locomotion: Wheelchair Locomotion: Wheelchair: 2: Travels 50 - 149 ft with minimal assistance (Pt.>75%) FIM - Locomotion: Ambulation Locomotion: Ambulation Assistive Devices: Administrator Ambulation/Gait Assistance: 3: Mod assist Locomotion: Ambulation: 1: Travels less than 50 ft with moderate assistance (Pt: 50 - 74%) (7 feet)  Comprehension Comprehension Mode: Auditory Comprehension: 5-Follows basic conversation/direction: With no assist  Expression Expression Mode: Verbal Expression: 5-Expresses basic needs/ideas: With no assist  Social Interaction Social Interaction: 5-Interacts appropriately 90% of the time - Needs monitoring or encouragement for participation or interaction.  Problem Solving Problem Solving: 5-Solves basic problems: With no assist  Memory Memory: 4-Recognizes or recalls 75 - 89% of the time/requires cueing 10 - 24% of the time   Medical Problem List and Plan:  1. Deconditioning after a retroperitoneal hematoma felt to be secondary to anti-coagulation.Eliquis to be held 3-4 weeks  2. DVT Prophylaxis/Anticoagulation: SCDs. Recent venous Doppler studies negative  3. Pain Management: Ultram as needed. Monitor with increased mobility  4. Neuropsych: This patient is capable of making decisions on his own behalf.  5. Suspect gout right lower extremity. Uric acid level 8.1. Received Depo-Medrol injection times one. Placed on colchicine 11/10/2013  6. Chronic renal insufficiency. Baseline creatinine 1 point and monitor. Repeat chemistries  7. Atrial fibrillation. Continue Tikosyn 250 mcg twice a day, Toprol-XL 200 mg daily. Again Eliquis to be held 3-4 weeks secondary to retroperitoneal hematoma. Cardiac rate remains stable  8.  R peroneal DVT, will await official report prior to making any further  decisions, if just  peroneal and not popliteal can repeat doppler on Friday and allow full activity LOS (Days) 3 A FACE TO FACE EVALUATION WAS PERFORMED  KIRSTEINS,ANDREW E 11/13/2013, 8:02 AM

## 2013-11-13 NOTE — IPOC Note (Signed)
Overall Plan of Care [facility]) Patient Details Name: Jeff Henson MRN: [id number] DOB: 1946-11-09  Admitting Diagnosis: Deconditioned   Hospital Problems: Active Problems:   Retroperitoneal bleeding     Functional Problem List: Nursing Bladder;Bowel;Medication Management;Pain;Safety;Skin Integrity  PT Balance;Endurance;Pain;Safety;Motor  OT Balance;Edema;Endurance;Pain;Safety;Cognition  SLP    TR         Basic ADL's: OT Grooming;Bathing;Dressing;Toileting     Advanced  ADL's: OT       Transfers: PT Bed Mobility;Bed to Chair  OT Toilet;Tub/Shower     Locomotion: PT Ambulation;Stairs     Additional Impairments: OT    SLP        TR      Anticipated Outcomes Item Anticipated Outcome  Self Feeding    Swallowing      Basic self-care     Toileting      Bathroom Transfers    Bowel/Bladder  cont of bowel and bladder  Transfers  Supervision for all functional mobility  Locomotion  Home with Supervision  Communication     Cognition     Pain  less than 3  Safety/Judgment  adhere to safety protocol   Therapy Plan: PT Intensity: Minimum of 1-2 x/day ,45 to 90 minutes PT Frequency: 5 out of 7 days PT Duration Estimated Length of Stay: 2 weeks OT Intensity: Minimum of 1-2 x/day, 45 to 90 minutes OT Frequency: 5 out of 7 days OT Duration/Estimated Length of Stay: 7-10         Team Interventions: Nursing Interventions Patient/Family Education;Disease Management/Prevention;Psychosocial Support;Bowel Management;Bladder Management;Discharge Planning;Pain Management;Medication Management  PT interventions Ambulation/gait training;Balance/vestibular training;Neuromuscular re-education;Functional mobility training;Therapeutic Exercise;Therapeutic Activities;Wheelchair propulsion/positioning;Stair training;UE/LE Strength taining/ROM  OT Interventions Balance/vestibular training;Cognitive remediation/compensation;Discharge planning;DME/adaptive equipment  instruction;Psychosocial support;Patient/family education;Pain management;Functional mobility training;Self Care/advanced ADL retraining;Wheelchair propulsion/positioning;Therapeutic Activities;Therapeutic Exercise;UE/LE Strength taining/ROM  SLP Interventions    TR Interventions    SW/CM Interventions      Team Discharge Planning: Destination: PT-Home ,OT-   , SLP-  Projected Follow-up: PT-Home health PT, OT-   , SLP-  Projected Equipment Needs: PT-Rolling walker with 5" wheels;Tub/shower bench;To be determined, OT-  , SLP-  Equipment Details: PT- , OT-  Patient/family involved in discharge planning: PT- Patient,  OT- , SLP-   MD ELOS: 7-10 days Medical Rehab Prognosis:  Good Assessment: 68 y.o. right-handed male with history of atrial fibrillation and pulmonary emboli maintained on Eliquis, chronic renal insufficiency with baseline creatinine 1.4. Patient independent and driving prior to admission. Admitted to [facility] 11/04/2013 with chest and back pain. CTA chest revealed retroperitoneal hemorrhage secondary to anticoagulation. Anti-coagulation held anticipate 3-4 weeks. Lower extremity Doppler showed no signs of DVT. Atrial fibrillation maintained on Tikosyn as well as Toprol. Complaints of bilateral foot pain right greater than left x-ray of right foot showed no acute osseous injury there was some mild soft tissue swelling over the dorsal aspect of the right foot.  Uric acid level mildly elevated at 8.1. Patient did receive Depo-Medrol injection 11/09/2013 and placed on colchicine 11/10/2013  Now requiring 24/7 Rehab RN,MD, as well as CIR level PT, OT and SLP.  Treatment team will focus on ADLs and mobility with goals set at sup/mod I  See Team Conference Notes for weekly updates to the plan of care

## 2013-11-14 ENCOUNTER — Inpatient Hospital Stay (HOSPITAL_COMMUNITY): Payer: Medicare Other

## 2013-11-14 ENCOUNTER — Inpatient Hospital Stay (HOSPITAL_COMMUNITY): Payer: Medicare Other | Admitting: Occupational Therapy

## 2013-11-14 ENCOUNTER — Inpatient Hospital Stay (HOSPITAL_COMMUNITY): Payer: Medicare Other | Admitting: Physical Therapy

## 2013-11-14 DIAGNOSIS — M109 Gout, unspecified: Secondary | ICD-10-CM

## 2013-11-14 DIAGNOSIS — R5381 Other malaise: Secondary | ICD-10-CM

## 2013-11-14 MED ORDER — DICLOFENAC SODIUM 1 % TD GEL
2.0000 g | Freq: Four times a day (QID) | TRANSDERMAL | Status: DC
  Administered 2013-11-14 – 2013-11-17 (×13): 2 g via TOPICAL
  Filled 2013-11-14: qty 100

## 2013-11-14 MED ORDER — COLCHICINE 0.6 MG PO TABS
0.6000 mg | ORAL_TABLET | Freq: Two times a day (BID) | ORAL | Status: DC
  Administered 2013-11-14 – 2013-11-16 (×5): 0.6 mg via ORAL
  Filled 2013-11-14 (×7): qty 1

## 2013-11-14 NOTE — Progress Notes (Signed)
Occupational Therapy Session Note  Patient Details  Name: Jeff Henson MRN: [id number] Date of Birth: 12/18/1945  Today's Date: 11/14/2013 Time: 1135-1200 Time Calculation (min): 25 min  Short Term Goals: Week 1:  OT Short Term Goal 1 (Week 1): Patient will complete wheelchair to toilet transfer with supervision. OT Short Term Goal 2 (Week 1): Patient will complete toileting with supervision. OT Short Term Goal 3 (Week 1): Patient will complete upper body dressing independently. OT Short Term Goal 4 (Week 1): Patient will complete lower body dressing with supervision. OT Short Term Goal 5 (Week 1): Patient will complete shower transfers with supervision.  Skilled Therapeutic Interventions/Progress Updates:  Patient found seated edge of bed with wife present. Patient eager to take a shower this am. Patient ambulated from edge of bed > bathroom for shower stall transfer on/off shower seat. From here, patient completed UB/LB bathing in sit<>stand position using grab bars prn with supervision. Patient sat in shower for UB dressing, then ambulated > w/c for LB dressing. Patient's wife assisted him with socks, therefore he required min assist for LB dressing. At end of session, left patient seated in w/c with all needed items within reach and wife present.   Precautions:  Precautions Precautions: Fall Restrictions Weight Bearing Restrictions: No  See FIM for current functional status  Therapy/Group: Individual Therapy  Bryla Burek 11/14/2013, 12:04 PM

## 2013-11-14 NOTE — Progress Notes (Signed)
Physical Therapy Note  Patient Details  Name: Jeff Henson MRN: [id number] Date of Birth: October 26, 1946 Today's Date: 11/14/2013  0600-4599 (55 minutes) individual Pain: 6/10 RT LE/ meds given (decreasd to 4/10) Focus of treatment: wc mobility for activity tolerance; gait training on level /steps Treatment: Pt in bed upon arrival with c/o pain as above; supine to sit SBA ; transfers stand/turn RW close SBA with vcs for hand placement; wc mobility- 120 feet SBA X 2 with increased time; gait up/down 2 steps  (6 inch) using one rail (to simulate pole at pts home) min assist for safety; gait 75 feet X 2 RW min to close SBA.    Leigha Olberding,JIM 11/14/2013, 10:03 AM

## 2013-11-14 NOTE — Progress Notes (Signed)
Occupational Therapy Session Note  Patient Details  Name: Jeff Henson MRN: [id number] Date of Birth: Mar 28, 1946  Today's Date: 11/14/2013 Time: 0730-0819 and 1304-1400 Time Calculation (min): 49 min and 56 min   Short Term Goals: Week 1:  OT Short Term Goal 1 (Week 1): Patient will complete wheelchair to toilet transfer with supervision. OT Short Term Goal 2 (Week 1): Patient will complete toileting with supervision. OT Short Term Goal 3 (Week 1): Patient will complete upper body dressing independently. OT Short Term Goal 4 (Week 1): Patient will complete lower body dressing with supervision. OT Short Term Goal 5 (Week 1): Patient will complete shower transfers with supervision.  Skilled Therapeutic Interventions/Progress Updates:    Session 1: Therapy session focused on safety awareness, standing balance, and problem solving skills during IADL tasks and therapeutic activities. Pt received sitting EOB and declining shower at this time secondary to waiting for wife to bring more clothing. Pt agreeable to therapy at this time and agreed to complete bathing during PM OT session. Pt then became slightly confused as he was requesting to wait for wife before completing any therapy however after further discussion he was agreeable to therapy. Engaged in pipe puzzle activity of 15 pieces with min cues for problem solving and increased time. Pt completed activity in standing with emphasis on weight shifting towards right side to reach for items. Discussed kitchen and dining room setup at home. Practiced retrieving items from refrigerator and transporting them to table approx 10 feet aware to simulate home environment. Pt completed ambulation with RW and transporting items at supervision level with min cues for technique. Pt with no cues for carryover of techniques when transporting items back to table. Discussed sliding items down counter and using containers with lids, both of which pt was very open to.  Pt declined RW bag at this time however will address this again later. At end of session pt left in w/c with all needs in reach. Pt verbalized need to press call light when wanting to get up from chair.   Session 2: Therapy session focused on activity tolerance, dynamic standing balance, and UE strengthening. Pt received sitting in w/c with wife present. Completed family training and wife returned demonstration with pt so she could be checked off to transfer pt to toilet and bed<>w/c. Pt propelled himself in w/c to day room with 2 rest breaks and min cues. Engaged in dynamic standing balance task of playing golf using putting green. Pt very motivated by this task as he used to play golf frequently. Pt initially required stand by assist then required min guard assist d/t fatigue. Pt required approx 6 rest breaks during activity. Pt then completed theraband exercises for UB strengthening to assist with sit<>stand. At end of session pt left sitting in w/c in room with wife present.  Therapy Documentation Precautions:  Precautions Precautions: Fall Restrictions Weight Bearing Restrictions: No General:   Vital Signs: Therapy Vitals Temp: 98.4 F (36.9 C) Temp src: Oral Pulse Rate: 79 Resp: 18 BP: 116/63 mmHg Patient Position, if appropriate: Sitting Oxygen Therapy SpO2: 91 % O2 Device: None (Room air) Pain: Pt reporting 4/10 pain in RLE at knee and ankle area.  See FIM for current functional status  Therapy/Group: Individual Therapy  Duayne Cal 11/14/2013, 8:26 AM

## 2013-11-14 NOTE — Progress Notes (Signed)
Subjective/Complaints: No chief complaint on file.  :  Chief complaint: Right foot pain  HPI: Jeff Henson is a 68 y.o. right-handed male with history of atrial fibrillation and pulmonary emboli maintained on Eliquis, chronic renal insufficiency with baseline creatinine 1.4. Patient independent and driving prior to admission. Admitted to [facility] 11/04/2013 with chest and back pain. CTA chest revealed retroperitoneal hemorrhage secondary to anticoagulation. Anti-coagulation held anticipate 3-4 weeks. Lower extremity Doppler showed no signs of DVT. Atrial fibrillation maintained on Tikosyn as well as Toprol. Complaints of bilateral foot pain right greater than left x-ray of right foot showed no acute osseous injury there was some mild soft tissue swelling over the dorsal aspect of the right foot.  Uric acid level mildly elevated at 8.1. Patient did receive Depo-Medrol injection 11/09/2013 and placed on colchicine 11/10/2013. Physical and occupational therapy evaluation completed 11/07/2013 with recommendations for physical medicine rehabilitation consult to consider inpatient rehabilitation services. Patient was felt to be a good candidate for inpatient rehabilitation services and was admitted for comprehensive rehabilitation program   Doppler unofficial report revealed R peroneal DVT, radiology report pending Has knee pain not calf pain, no thigh  , no SOB Objective: Vital Signs: Blood pressure 116/63, pulse 79, temperature 98.4 F (36.9 C), temperature source Oral, resp. rate 18, SpO2 91.00%. No results found. Results for orders placed during the hospital encounter of 11/10/13 (from the past 72 hour(s))  CBC WITH DIFFERENTIAL     Status: Abnormal   Collection Time    11/12/13  3:18 PM      Result Value Range   WBC 9.2  4.0 - 10.5 K/uL   RBC 3.32 (*) 4.22 - 5.81 MIL/uL   Hemoglobin 11.0 (*) 13.0 - 17.0 g/dL   HCT 33.1 (*) 39.0 - 52.0 %   MCV 99.7  78.0 - 100.0 fL    MCH 33.1  26.0 - 34.0 pg   MCHC 33.2  30.0 - 36.0 g/dL   RDW 13.4  11.5 - 15.5 %   Platelets 287  150 - 400 K/uL   Neutrophils Relative % 70  43 - 77 %   Neutro Abs 6.5  1.7 - 7.7 K/uL   Lymphocytes Relative 19  12 - 46 %   Lymphs Abs 1.8  0.7 - 4.0 K/uL   Monocytes Relative 5  3 - 12 %   Monocytes Absolute 0.5  0.1 - 1.0 K/uL   Eosinophils Relative 5  0 - 5 %   Eosinophils Absolute 0.5  0.0 - 0.7 K/uL   Basophils Relative 0  0 - 1 %   Basophils Absolute 0.0  0.0 - 0.1 K/uL  BASIC METABOLIC PANEL     Status: Abnormal   Collection Time    11/12/13  3:18 PM      Result Value Range   Sodium 143  137 - 147 mEq/L   Comment: Please note change in reference range.   Potassium 4.6  3.7 - 5.3 mEq/L   Comment: Please note change in reference range.   Chloride 100  96 - 112 mEq/L   CO2 33 (*) 19 - 32 mEq/L   Glucose, Bld 124 (*) 70 - 99 mg/dL   BUN 34 (*) 6 - 23 mg/dL   Creatinine, Ser 1.53 (*) 0.50 - 1.35 mg/dL   Calcium 8.7  8.4 - 10.5 mg/dL   GFR calc non Af Amer 45 (*) >90 mL/min   GFR calc Af Amer 52 (*) >90 mL/min  Comment: (NOTE)     The eGFR has been calculated using the CKD EPI equation.     This calculation has not been validated in all clinical situations.     eGFR's persistently <90 mL/min signify possible Chronic Kidney     Disease.  CBC WITH DIFFERENTIAL     Status: Abnormal   Collection Time    11/13/13  5:50 AM      Result Value Range   WBC 9.5  4.0 - 10.5 K/uL   RBC 3.42 (*) 4.22 - 5.81 MIL/uL   Hemoglobin 11.1 (*) 13.0 - 17.0 g/dL   HCT 33.6 (*) 39.0 - 52.0 %   MCV 98.2  78.0 - 100.0 fL   MCH 32.5  26.0 - 34.0 pg   MCHC 33.0  30.0 - 36.0 g/dL   RDW 13.1  11.5 - 15.5 %   Platelets 330  150 - 400 K/uL   Neutrophils Relative % 69  43 - 77 %   Neutro Abs 6.6  1.7 - 7.7 K/uL   Lymphocytes Relative 8 (*) 12 - 46 %   Lymphs Abs 0.8  0.7 - 4.0 K/uL   Monocytes Relative 17 (*) 3 - 12 %   Monocytes Absolute 1.6 (*) 0.1 - 1.0 K/uL   Eosinophils Relative 6 (*) 0 - 5  %   Eosinophils Absolute 0.5  0.0 - 0.7 K/uL   Basophils Relative 0  0 - 1 %   Basophils Absolute 0.0  0.0 - 0.1 K/uL  COMPREHENSIVE METABOLIC PANEL     Status: Abnormal   Collection Time    11/13/13  5:50 AM      Result Value Range   Sodium 141  137 - 147 mEq/L   Comment: Please note change in reference range.   Potassium 4.9  3.7 - 5.3 mEq/L   Comment: Please note change in reference range.   Chloride 101  96 - 112 mEq/L   CO2 28  19 - 32 mEq/L   Glucose, Bld 104 (*) 70 - 99 mg/dL   BUN 29 (*) 6 - 23 mg/dL   Creatinine, Ser 1.48 (*) 0.50 - 1.35 mg/dL   Calcium 8.8  8.4 - 10.5 mg/dL   Total Protein 6.9  6.0 - 8.3 g/dL   Albumin 2.8 (*) 3.5 - 5.2 g/dL   AST 78 (*) 0 - 37 U/L   ALT 136 (*) 0 - 53 U/L   Alkaline Phosphatase 169 (*) 39 - 117 U/L   Total Bilirubin 1.0  0.3 - 1.2 mg/dL   GFR calc non Af Amer 47 (*) >90 mL/min   GFR calc Af Amer 54 (*) >90 mL/min   Comment: (NOTE)     The eGFR has been calculated using the CKD EPI equation.     This calculation has not been validated in all clinical situations.     eGFR's persistently <90 mL/min signify possible Chronic Kidney     Disease.      Physical Exam  Constitutional: He is oriented to person, place, and time.  HENT:  Head: Normocephalic.  Eyes: EOM are normal.  Neck: Normal range of motion. Neck supple. No thyromegaly present.  Cardiovascular:  Cardiac rate controlled  Respiratory: Effort normal and breath sounds normal. No respiratory distress.  GI: Soft. Bowel sounds are normal. He exhibits no distension. There is no tenderness.  Musculoskeletal:  Right foot and dorsal tenderness with palpable pedal pulses Swelling R>L foot and ankle, tenderness with right great toe ROM  Mild effusion R knee pain with AROM/PROM Mild pain but no swelling L knee  Neurological: He is alert and oriented to person, place, and time. No sensory deficit.  Strength is 5/5 in bilateral deltoid, bicep, tricep, grip 3 minus/5 Right knee  extensor, 2 minus in the ankle dorsiflexor and plantar flexor, 2 minus in the hip flexor all limited by pain, Left HF,KE,ADF 4/5  Skin: Skin is warm and dry       Assessment/Plan: 1. Functional deficits secondary to deconditioning retroperitoneal hematoma which require 3+ hours per day of interdisciplinary therapy in a comprehensive inpatient rehab setting. Physiatrist is providing close team supervision and 24 hour management of active medical problems listed below. Physiatrist and rehab team continue to assess barriers to discharge/monitor patient progress toward functional and medical goals. FIM: FIM - Bathing Bathing Steps Patient Completed: Chest;Right Arm;Left Arm;Abdomen;Front perineal area;Buttocks;Right upper leg;Left upper leg;Right lower leg (including foot);Left lower leg (including foot) Bathing: 5: Set-up assist to: Obtain items (supervision for safety/verbal cues)  FIM - Upper Body Dressing/Undressing Upper body dressing/undressing steps patient completed: Thread/unthread right sleeve of pullover shirt/dresss;Thread/unthread left sleeve of pullover shirt/dress;Put head through opening of pull over shirt/dress;Pull shirt over trunk Upper body dressing/undressing: 5: Set-up assist to: Obtain clothing/put away FIM - Lower Body Dressing/Undressing Lower body dressing/undressing steps patient completed: Thread/unthread right underwear leg;Pull underwear up/down;Thread/unthread right pants leg;Pull pants up/down Lower body dressing/undressing: 3: Mod-Patient completed 50-74% of tasks  FIM - Toileting Toileting steps completed by patient: Adjust clothing prior to toileting;Performs perineal hygiene;Adjust clothing after toileting Toileting Assistive Devices: Grab bar or rail for support Toileting: 4: Steadying assist  FIM - Air cabin crew Transfers: 3-To toilet/BSC: Mod A (lift or lower assist);3-From toilet/BSC: Mod A (lift or lower assist) (per Coralee Pesa, NT  report)  FIM - Bed/Chair Transfer Bed/Chair Transfer Assistive Devices: Gilford Rile;Arm rests Bed/Chair Transfer: 5: Bed > Chair or W/C: Supervision (verbal cues/safety issues);5: Chair or W/C > Bed: Supervision (verbal cues/safety issues)  FIM - Locomotion: Wheelchair Distance: 2x75 Locomotion: Wheelchair: 2: Travels 50 - 149 ft with minimal assistance (Pt.>75%) FIM - Locomotion: Ambulation Locomotion: Ambulation Assistive Devices: Administrator Ambulation/Gait Assistance: 4: Min guard Locomotion: Ambulation: 1: Travels less than 50 ft with minimal assistance (Pt.>75%)  Comprehension Comprehension Mode: Auditory Comprehension: 5-Follows basic conversation/direction: With no assist  Expression Expression Mode: Verbal Expression: 5-Expresses basic needs/ideas: With no assist  Social Interaction Social Interaction: 5-Interacts appropriately 90% of the time - Needs monitoring or encouragement for participation or interaction.  Problem Solving Problem Solving: 5-Solves basic problems: With no assist  Memory Memory: 4-Recognizes or recalls 75 - 89% of the time/requires cueing 10 - 24% of the time   Medical Problem List and Plan:  1. Deconditioning after a retroperitoneal hematoma felt to be secondary to anti-coagulation.Eliquis to be held 3-4 weeks  2. DVT Prophylaxis/Anticoagulation: SCDs. Recent venous Doppler studies negative  3. Pain Management: Ultram as needed. Monitor with increased mobility, knee pain appears gout related, increase colchicine and add diclofenac gel 4. Neuropsych: This patient is capable of making decisions on his own behalf.  5. Suspect gout right lower extremity. Uric acid level 8.1. Received Depo-Medrol injection times one. Placed on colchicine 11/10/2013  6. Chronic renal insufficiency. Baseline creatinine 1 point and monitor. Repeat chemistries On colchicine 7. Atrial fibrillation. Continue Tikosyn 250 mcg twice a day, Toprol-XL 200 mg daily. Again Eliquis  to be held 3-4 weeks secondary to retroperitoneal hematoma. Cardiac rate remains stable  8.  R peroneal  DVT, per official report , discussed with Dr Donnetta Hutching,  just peroneal and not popliteal can repeat doppler on Friday and allow full activity LOS (Days) Ong E 11/14/2013, 7:53 AM

## 2013-11-15 ENCOUNTER — Inpatient Hospital Stay (HOSPITAL_COMMUNITY): Payer: Medicare Other | Admitting: Physical Therapy

## 2013-11-15 ENCOUNTER — Inpatient Hospital Stay (HOSPITAL_COMMUNITY): Payer: Medicare Other

## 2013-11-15 NOTE — Progress Notes (Signed)
Subjective/Complaints: No foot pain, hx of R knee pain and hx of cortisone injections as well  Doppler unofficial report revealed R peroneal DVT, radiology report pending Has knee pain not calf pain, no thigh  , no SOB Objective: Vital Signs: Blood pressure 148/62, pulse 71, temperature 98.5 F (36.9 C), temperature source Oral, resp. rate 17, SpO2 93.00%. No results found. Results for orders placed during the hospital encounter of 11/10/13 (from the past 72 hour(s))  CBC WITH DIFFERENTIAL     Status: Abnormal   Collection Time    11/12/13  3:18 PM      Result Value Range   WBC 9.2  4.0 - 10.5 K/uL   RBC 3.32 (*) 4.22 - 5.81 MIL/uL   Hemoglobin 11.0 (*) 13.0 - 17.0 g/dL   HCT 33.1 (*) 39.0 - 52.0 %   MCV 99.7  78.0 - 100.0 fL   MCH 33.1  26.0 - 34.0 pg   MCHC 33.2  30.0 - 36.0 g/dL   RDW 13.4  11.5 - 15.5 %   Platelets 287  150 - 400 K/uL   Neutrophils Relative % 70  43 - 77 %   Neutro Abs 6.5  1.7 - 7.7 K/uL   Lymphocytes Relative 19  12 - 46 %   Lymphs Abs 1.8  0.7 - 4.0 K/uL   Monocytes Relative 5  3 - 12 %   Monocytes Absolute 0.5  0.1 - 1.0 K/uL   Eosinophils Relative 5  0 - 5 %   Eosinophils Absolute 0.5  0.0 - 0.7 K/uL   Basophils Relative 0  0 - 1 %   Basophils Absolute 0.0  0.0 - 0.1 K/uL  BASIC METABOLIC PANEL     Status: Abnormal   Collection Time    11/12/13  3:18 PM      Result Value Range   Sodium 143  137 - 147 mEq/L   Comment: Please note change in reference range.   Potassium 4.6  3.7 - 5.3 mEq/L   Comment: Please note change in reference range.   Chloride 100  96 - 112 mEq/L   CO2 33 (*) 19 - 32 mEq/L   Glucose, Bld 124 (*) 70 - 99 mg/dL   BUN 34 (*) 6 - 23 mg/dL   Creatinine, Ser 1.53 (*) 0.50 - 1.35 mg/dL   Calcium 8.7  8.4 - 10.5 mg/dL   GFR calc non Af Amer 45 (*) >90 mL/min   GFR calc Af Amer 52 (*) >90 mL/min   Comment: (NOTE)     The eGFR has been calculated using the CKD EPI equation.     This calculation has not been validated in all  clinical situations.     eGFR's persistently <90 mL/min signify possible Chronic Kidney     Disease.  CBC WITH DIFFERENTIAL     Status: Abnormal   Collection Time    11/13/13  5:50 AM      Result Value Range   WBC 9.5  4.0 - 10.5 K/uL   RBC 3.42 (*) 4.22 - 5.81 MIL/uL   Hemoglobin 11.1 (*) 13.0 - 17.0 g/dL   HCT 33.6 (*) 39.0 - 52.0 %   MCV 98.2  78.0 - 100.0 fL   MCH 32.5  26.0 - 34.0 pg   MCHC 33.0  30.0 - 36.0 g/dL   RDW 13.1  11.5 - 15.5 %   Platelets 330  150 - 400 K/uL   Neutrophils Relative % 69  43 -  77 %   Neutro Abs 6.6  1.7 - 7.7 K/uL   Lymphocytes Relative 8 (*) 12 - 46 %   Lymphs Abs 0.8  0.7 - 4.0 K/uL   Monocytes Relative 17 (*) 3 - 12 %   Monocytes Absolute 1.6 (*) 0.1 - 1.0 K/uL   Eosinophils Relative 6 (*) 0 - 5 %   Eosinophils Absolute 0.5  0.0 - 0.7 K/uL   Basophils Relative 0  0 - 1 %   Basophils Absolute 0.0  0.0 - 0.1 K/uL  COMPREHENSIVE METABOLIC PANEL     Status: Abnormal   Collection Time    11/13/13  5:50 AM      Result Value Range   Sodium 141  137 - 147 mEq/L   Comment: Please note change in reference range.   Potassium 4.9  3.7 - 5.3 mEq/L   Comment: Please note change in reference range.   Chloride 101  96 - 112 mEq/L   CO2 28  19 - 32 mEq/L   Glucose, Bld 104 (*) 70 - 99 mg/dL   BUN 29 (*) 6 - 23 mg/dL   Creatinine, Ser 1.48 (*) 0.50 - 1.35 mg/dL   Calcium 8.8  8.4 - 10.5 mg/dL   Total Protein 6.9  6.0 - 8.3 g/dL   Albumin 2.8 (*) 3.5 - 5.2 g/dL   AST 78 (*) 0 - 37 U/L   ALT 136 (*) 0 - 53 U/L   Alkaline Phosphatase 169 (*) 39 - 117 U/L   Total Bilirubin 1.0  0.3 - 1.2 mg/dL   GFR calc non Af Amer 47 (*) >90 mL/min   GFR calc Af Amer 54 (*) >90 mL/min   Comment: (NOTE)     The eGFR has been calculated using the CKD EPI equation.     This calculation has not been validated in all clinical situations.     eGFR's persistently <90 mL/min signify possible Chronic Kidney     Disease.      Physical Exam  Constitutional: He is oriented  to person, place, and time.  HENT:  Head: Normocephalic.  Eyes: EOM are normal.  Neck: Normal range of motion. Neck supple. No thyromegaly present.  Cardiovascular:  Cardiac rate controlled  Respiratory: Effort normal and breath sounds normal. No respiratory distress.  GI: Soft. Bowel sounds are normal. He exhibits no distension. There is no tenderness.  Musculoskeletal:  Right foot and dorsal tenderness with palpable pedal pulses Swelling R>L foot and ankle, tenderness with right great toe ROM   Mild effusion R knee pain with AROM/PROM Mild pain but no swelling L knee  Neurological: He is alert and oriented to person, place, and time. No sensory deficit.  Strength is 5/5 in bilateral deltoid, bicep, tricep, grip 3 minus/5 Right knee extensor, 2 minus in the ankle dorsiflexor and plantar flexor, 2 minus in the hip flexor all limited by pain, Left HF,KE,ADF 4/5  Skin: Skin is warm and dry       Assessment/Plan: 1. Functional deficits secondary to deconditioning retroperitoneal hematoma which require 3+ hours per day of interdisciplinary therapy in a comprehensive inpatient rehab setting. Physiatrist is providing close team supervision and 24 hour management of active medical problems listed below. Physiatrist and rehab team continue to assess barriers to discharge/monitor patient progress toward functional and medical goals. FIM: FIM - Bathing Bathing Steps Patient Completed: Chest;Right Arm;Left Arm;Abdomen;Front perineal area;Buttocks;Right upper leg;Left upper leg;Right lower leg (including foot);Left lower leg (including foot) Bathing: 5: Supervision:  Safety issues/verbal cues  FIM - Upper Body Dressing/Undressing Upper body dressing/undressing steps patient completed: Thread/unthread right sleeve of pullover shirt/dresss;Thread/unthread left sleeve of pullover shirt/dress;Put head through opening of pull over shirt/dress;Pull shirt over trunk Upper body dressing/undressing: 5:  Set-up assist to: Obtain clothing/put away FIM - Lower Body Dressing/Undressing Lower body dressing/undressing steps patient completed: Thread/unthread right underwear leg;Pull underwear up/down;Thread/unthread right pants leg;Pull pants up/down;Thread/unthread left underwear leg;Thread/unthread left pants leg Lower body dressing/undressing: 4: Min-Patient completed 75 plus % of tasks  FIM - Toileting Toileting steps completed by patient: Adjust clothing prior to toileting;Performs perineal hygiene;Adjust clothing after toileting Toileting Assistive Devices: Grab bar or rail for support Toileting: 5: Supervision: Safety issues/verbal cues  FIM - Radio producer Devices: Insurance account manager Transfers: 5-To toilet/BSC: Supervision (verbal cues/safety issues);5-From toilet/BSC: Supervision (verbal cues/safety issues)  FIM - Control and instrumentation engineer Devices: Walker;Arm rests Bed/Chair Transfer: 5: Bed > Chair or W/C: Supervision (verbal cues/safety issues);5: Chair or W/C > Bed: Supervision (verbal cues/safety issues)  FIM - Locomotion: Wheelchair Distance: 2x75 Locomotion: Wheelchair: 2: Travels 50 - 149 ft with minimal assistance (Pt.>75%) FIM - Locomotion: Ambulation Locomotion: Ambulation Assistive Devices: Administrator Ambulation/Gait Assistance: 4: Min guard Locomotion: Ambulation: 1: Travels less than 50 ft with minimal assistance (Pt.>75%)  Comprehension Comprehension Mode: Auditory Comprehension: 5-Follows basic conversation/direction: With no assist  Expression Expression Mode: Verbal Expression: 5-Expresses basic needs/ideas: With no assist  Social Interaction Social Interaction: 5-Interacts appropriately 90% of the time - Needs monitoring or encouragement for participation or interaction.  Problem Solving Problem Solving: 5-Solves basic problems: With no assist  Memory Memory: 4-Recognizes or recalls 75 - 89% of the  time/requires cueing 10 - 24% of the time   Medical Problem List and Plan:  1. Deconditioning after a retroperitoneal hematoma felt to be secondary to anti-coagulation.Eliquis to be held 3-4 weeks  2. DVT Prophylaxis/Anticoagulation: SCDs. +Right calf DVT, no progressive swelling 3. Pain Management: Ultram as needed. Monitor with increased mobility, knee pain appears gout related, increase colchicine and add diclofenac gel 4. Neuropsych: This patient is capable of making decisions on his own behalf.  5. Suspect gout right lower extremity. Uric acid level 8.1. Received Depo-Medrol injection times one. Placed on colchicine 11/10/2013 overall improved but also suspect OA  6. Chronic renal insufficiency. Baseline creatinine 1 point and monitor. Repeat chemistries On colchicine 7. Atrial fibrillation. Continue Tikosyn 250 mcg twice a day, Toprol-XL 200 mg daily. Again Eliquis to be held 3-4 weeks secondary to retroperitoneal hematoma. Cardiac rate remains stable  8.  R peroneal DVT, per official report , discussed with Dr Donnetta Hutching,  just peroneal and not popliteal can repeat doppler on Friday and allow full activity LOS (Days) [place] E 11/15/2013, 10:34 AM

## 2013-11-15 NOTE — Progress Notes (Signed)
Social Work Patient ID: Jeff Henson, male   DOB: 1946/09/15, 68 y.o.   MRN: [id number] Met with pt and wife to inform of team conference goals-mod/i level and discharge 1/9.  Aware will need follow up doppler on Friday to check DVT. Both are pleased with how well he is doing and family education begin completed.  Discussed DME they plan to get tub bench on their own but want rolling walker and bedside commode. Awaiting PT regarding follow up therapy.

## 2013-11-15 NOTE — Progress Notes (Signed)
Physical Therapy Session Note  Patient Details  Name: Jeff Henson MRN: [id number] Date of Birth: 1946/01/19  Today's Date: 11/15/2013 Time: 0800-0900  Time Calculation (min): 60 min  Short Term Goals: Week 1:  PT Short Term Goal 1 (Week 1): STG =LTG  Skilled Therapeutic Interventions/Progress Updates:    Treatment Session 1: Pt received seated in w/c with wife present; agreeable to therapy. Pt oriented x4 with use of visual aid on wall to orient self to date. Per conversation with wife, primary entrance of home has 2 steps to enter with (stable) pole on R side for UE support. Session focused on initiating hands-on family training with wife. Pt performed w/c mobility x150' in controlled environment with supervision/cueing to navigate over door sills, through narrow doorways. Negotiation of 2 stairs x4 trials total with R rail, pt facing sideways with bilat UE support at single rail, step-to pattern with supervision and mod verbal cues for safety and technique. Pt's wife then provided assist for 3 remaining stair negotiation trials; therapist provided instruction during initial 2 trials for appropriate cueing/assist for husband. Wife provided appropriate assist/cueing during final trial.   Gait x 150 in controlled and home environments with rolling walker and supervision, cueing for safety awareness (focus on decreasing gait speed in home environment and maintaining feet between posterior 2 legs of rolling walker). Pt required verbal reinforcement of cues throughout remainder of session. Performed car transfer x3 trials with rolling walker, supervision/cueing for hand placement and safety awareness. Initial car transfer with supervision/cueing provided by this PT. Remaining 2 car  transfers with appropriate supervision/cueing of wife.  Performed sit<>stand from apartment couch with close supervision, rolling walker. Supine<>sit on apartment bed with mod I, increased time. Transported pt to room in  w/c( secondary to pt fatigue), where pt performed dynamic standing balance with single UE support at sink with supervision. Sidestepping with rolling walker x5 steps per direction with supervision, cueing for safe use of rolling walker. Therapist departed with pt seated in w/c with wife present and all needs within reach.  Long term goal for stair negotiation modified to 2 stairs with R rail secondary to therapist having obtained accurate information on home setup. Long term goal for w/c mobility modified to 150' as pt will not be utilizing w/c for mobility at discharge.   Therapy Documentation Precautions:  Precautions Precautions: Fall Restrictions Weight Bearing Restrictions: No Pain: Pain Assessment Pain Assessment: No/denies pain Pain Score: 0-No pain Locomotion : Ambulation Ambulation/Gait Assistance: 5: Supervision Wheelchair Mobility Distance: 150   See FIM for current functional status  Therapy/Group: Individual Therapy  Hobble, Malva Cogan 11/15/2013, 10:56 AM

## 2013-11-15 NOTE — Progress Notes (Signed)
Physical Therapy Session Note  Patient Details  Name: Jeff Henson MRN: [id number] Date of Birth: 08/07/1946  Today's Date: 11/15/2013 Time: 9937-1696 Time Calculation (min): 43 min  Short Term Goals: Week 1:  PT Short Term Goal 1 (Week 1): STG =LTG  Skilled Therapeutic Interventions/Progress Updates:    Pt received seated EOB; agreeable to therapy. Session focused on functional standing balance. Stand-pivot with bed>w/c wth supervision, cueing for safety awareness. Performed w/c mobility x150' in controlled environment with supervision. Once in gym, performed [employer] Scale. Pt scored 36/56; see below for detailed findings. Pt educated on score, risk of falling. Returned to room via gait x150' in controlled environment with rolling walker with supervision. Therapist departed with pt seated in w/c with all needs within reach.  Long term goal added to address dynamic standing balance secondary to balance impairments demonstrated during session.  Therapy Documentation Precautions:  Precautions Precautions: Fall Restrictions Weight Bearing Restrictions: No Vital Signs: Therapy Vitals Temp: 98.1 F (36.7 C) Temp src: Oral Pulse Rate: 65 Resp: 18 BP: 130/59 mmHg Patient Position, if appropriate: Lying Oxygen Therapy SpO2: 96 % O2 Device: None (Room air) Locomotion : Ambulation Ambulation/Gait Assistance: 5: Supervision Wheelchair Mobility Distance: 150  Balance: Balance Balance Assessed: Yes Standardized Balance Assessment Standardized Balance Assessment: Berg Balance Test Berg Balance Test Sit to Stand: Able to stand  independently using hands Standing Unsupported: Able to stand safely 2 minutes Sitting with Back Unsupported but Feet Supported on Floor or Stool: Able to sit safely and securely 2 minutes Stand to Sit: Controls descent by using hands Transfers: Able to transfer safely, definite need of hands Standing Unsupported with Eyes Closed: Able to stand 10  seconds safely Standing Ubsupported with Feet Together: Able to place feet together independently and stand for 1 minute with supervision From Standing, Reach Forward with Outstretched Arm: Can reach forward >12 cm safely (5") From Standing Position, Pick up Object from Floor: Able to pick up shoe, needs supervision From Standing Position, Turn to Look Behind Over each Shoulder: Needs assist to keep from losing balance and falling (posterior LOB when turning to look over L shoulder) Turn 360 Degrees: Able to turn 360 degrees safely but slowly Standing Unsupported, Alternately Place Feet on Step/Stool: Able to complete >2 steps/needs minimal assist Standing Unsupported, One Foot in Front: Able to plae foot ahead of the other independently and hold 30 seconds Standing on One Leg: Unable to try or needs assist to prevent fall Total Score: 36  See FIM for current functional status  Therapy/Group: Individual Therapy  Payal Stanforth, Malva Cogan 11/15/2013, 4:20 PM

## 2013-11-15 NOTE — Progress Notes (Signed)
Occupational Therapy Session Note  Patient Details  Name: Jeff Henson MRN: [id number] Date of Birth: [birth year]  Today's Date: 11/15/2013 Time: 9476-5465 and 0354-6568 Time Calculation (min): 55 min and 30 min   Short Term Goals: Week 1:  OT Short Term Goal 1 (Week 1): Patient will complete wheelchair to toilet transfer with supervision. OT Short Term Goal 2 (Week 1): Patient will complete toileting with supervision. OT Short Term Goal 3 (Week 1): Patient will complete upper body dressing independently. OT Short Term Goal 4 (Week 1): Patient will complete lower body dressing with supervision. OT Short Term Goal 5 (Week 1): Patient will complete shower transfers with supervision.  Skilled Therapeutic Interventions/Progress Updates:    Session 1: Pt seen for ADL retraining with focus on safety awareness, activity tolerance, sit<>stand, and functional transfers. Pt received sitting in w/c with wife and daughter present. Daughter attended B&D session in ADL apartment using tub. Pt competed tub transfer at supervision level with min instructional cues. Completed bathing at supervision level and min cues provided for positioning of BLE during sit<>stand. Completed dressing with increased time time and cues for crossover technique for donning bil socks. At end of session pt propelled self in w/c back to room for UE  Strengthening and activity tolerance. Pt required 2 rest breaks during this task. Had discussion with wife and daughter regarding DME. Pt and family agreeable that pt needs a TTB and BSC. SW entered and joined discussion about insurance not covering for TTB. Family agreeable to purchase this item as it is necessary for pt. Pt left sitting in w/c with all needs in reach.   Session 2: Pt received supine in bed and willing to participate in therapy at this time. Informed of planned discharge date on 11/17/13 and all were agreeable. Therapy session focused on UE strengthening to assist with  pushing self up to stand during functional transfers and utilizing UEs for support while in standing with RW. Provided pt with handout of exercises using theraband. Pt completed 12-15 reps of each exercise. Required min instructional and demonstrational cues during first set of exercises and no cues during second set of exercises. At end of session pt left sitting EOB with wife present and all needs in reach.   Therapy Documentation Precautions:  Precautions Precautions: Fall Restrictions Weight Bearing Restrictions: No General:   Vital Signs:   Pain: Pt reporting 4/10 pain in R knee during therapy sessions.   See FIM for current functional status  Therapy/Group: Individual Therapy  Ankita Newcomer, Quillian Quince 11/15/2013, 11:03 AM

## 2013-11-15 NOTE — Patient Care Conference (Signed)
Inpatient RehabilitationTeam Conference and Plan of Care Update Date: 11/15/2013   Time: 10;45 am    Patient Name: Jeff Henson      Medical Record Number: [id number]  Date of Birth: Feb 07, 1946 Sex: Male         Room/Bed: 4W24C/4W24C-01 Payor Info: Payor: MEDICARE / Plan: MEDICARE PART A / Product Type: *No Product type* /    Admitting Diagnosis: Deconditioned   Admit Date/Time:  11/10/2013  5:29 PM Admission Comments: No comment available   Primary Diagnosis:  <principal problem not specified> Principal Problem: <principal problem not specified>  Patient Active Problem List   Diagnosis Date Noted  . Atrial fibrillation 11/06/2013  . Chronic diastolic heart failure 62/83/1517  . Left ventricular dilatation 11/06/2013  . HTN (hypertension) 11/06/2013  . CKD (chronic kidney disease) stage 3, GFR 30-59 ml/min 11/06/2013  . Retroperitoneal bleeding 11/04/2013  . Acute blood loss anemia 11/04/2013  . Coagulopathy 11/04/2013  . History of pulmonary embolism 11/04/2013  . DVT (deep venous thrombosis) 11/04/2013    Expected Discharge Date: Expected Discharge Date: 11/17/13  Team Members Present: Physician leading conference: Dr. Alysia Penna Social Worker Present: Ovidio Kin, LCSW Nurse Present: Other (comment) Maudry Mayhew Rosero-RN) PT Present: Georjean Mode, PT;Other (comment) Jilda Roche) OT Present: Gareth Morgan, Lorelee Cover, OT SLP Present: Germain Osgood, SLP PPS Coordinator present : Daiva Nakayama, RN, CRRN     Current Status/Progress Goal Weekly Team Focus  Medical   Right calf DVT, no progressive swelling, off warfarin for retroperitoneal hematoma  prevent progression of DVT  mobilize, repeat    Bowel/Bladder   Patient's continent of B&B; Calls when he wants to go to the bathroom. Voided at 0039 and was scanned for 123.   Patient to continue to self void  Self void goals   Swallow/Nutrition/ Hydration     No issues        ADL's   supervision overall and  min assist with LB dressing  Mod I  safety awareness, activity tolerance, strengthening, functional transfers, education   Mobility   Mod I with bed mobility; supervision with transfers, gait; min A with dynamic standing balance  Independent with bed mobility; mod I with transfers, gait, and dynamic standing balance  Dynamic standing balance, safety awareness, gait/transfer training   Communication     [facility]        Safety/Cognition/ Behavioral Observations    No safety issues        Pain   PRN Tylenol 325-650mg  q 4 hrs, Tramadol 50mg  q 6 hrs. Denied pain  To maintain pain level at 2 or less  Assess pain q 4-6 hrs; reassess after intervention   Skin   Rash to back - hydrocortisone cream ordered. Buttock's red - EPBC is being used. Edema to R&L LE, R > L.; reddened area to R great toe.  Patient to remain free of infection and skin breakdown  Assess skin for breakdown; educate on the importance of skin care      *See Care Plan and progress notes for long and short-term goals.  Barriers to Discharge: needs repeat doppler    Possible Resolutions to Barriers:  see above    Discharge Planning/Teaching Needs:  Home with wife and daughter to assist some.  Wife here daily and participating in therapies with pt      Team Discussion: Voiding now-meeting mod/i level goals.  Off O2.  Follow up doppler on Friday could not be sooner-needs to be 5 days between. Family education being completed  Revisions to Treatment Plan:  Upgraded goals to mod/i level   Continued Need for Acute Rehabilitation Level of Care: The patient requires daily medical management by a physician with specialized training in physical medicine and rehabilitation for the following conditions: Daily direction of a multidisciplinary physical rehabilitation program to ensure safe treatment while eliciting the highest outcome that is of practical value to the patient.: Yes Daily medical management of patient stability for increased  activity during participation in an intensive rehabilitation regime.: Yes Daily analysis of laboratory values and/or radiology reports with any subsequent need for medication adjustment of medical intervention for : Other  Jeff Henson 11/16/2013, 2:44 PM

## 2013-11-16 ENCOUNTER — Inpatient Hospital Stay (HOSPITAL_COMMUNITY): Payer: Medicare Other

## 2013-11-16 ENCOUNTER — Inpatient Hospital Stay (HOSPITAL_COMMUNITY): Payer: Medicare Other | Admitting: Physical Therapy

## 2013-11-16 DIAGNOSIS — R5381 Other malaise: Secondary | ICD-10-CM

## 2013-11-16 DIAGNOSIS — I82409 Acute embolism and thrombosis of unspecified deep veins of unspecified lower extremity: Secondary | ICD-10-CM

## 2013-11-16 DIAGNOSIS — M109 Gout, unspecified: Secondary | ICD-10-CM

## 2013-11-16 MED ORDER — COLCHICINE 0.6 MG PO TABS
0.6000 mg | ORAL_TABLET | Freq: Every day | ORAL | Status: DC
  Administered 2013-11-17: 0.6 mg via ORAL
  Filled 2013-11-16 (×2): qty 1

## 2013-11-16 NOTE — Progress Notes (Signed)
Physical Therapy Discharge Summary  Patient Details  Name: Jeff Henson MRN: [id number] Date of Birth: 04/20/1946  Today's Date: 11/16/2013 Time: 0800-0900 and 1130-1200 Time Calculation (min): 60 min and 30 min  Patient has met 11 of 11 long term goals due to improved activity tolerance, improved balance, improved postural control, increased strength, increased range of motion, decreased pain, ability to compensate for deficits, functional use of  right lower extremity and improved awareness.  Patient to discharge at an ambulatory level Modified Independent.   Patient's care partner is independent to provide the necessary physical assistance at discharge.  Reasons goals not met: N/A; all goals met or surpassed.  Recommendation:  Patient will benefit from ongoing skilled PT services in home health setting to continue to advance safe functional mobility, address ongoing impairments in functional standing balance, gait stability, safety/independence with functional mobility, and minimize fall risk.  Equipment: bariatric rolling walker  Reasons for discharge: treatment goals met  Patient/family agrees with progress made and goals achieved: Yes  Skilled Therapeutic Interventions/Progress Updates: Treatment Session 1: Pt received seated EOB with wife present; reports no pain and agreeable to PT. Session focused on functional transfers/mobility and ambulation in home and community environments, hands-on family education. Pt performed w/c mobility x250' in controlled environment using bilat UE's with mod I. Performed gait x150' in controlled environment with rolling walker and mod I; gait x50' in home environment with rolling walker and mod I. Transported pt to hospital lobby in w/c, where pt performed gait x160' in community environment with rolling walker and supervision, min cueing (effectively provided by wife) required for safety awareness. Returned to rehab unit, where pt performed  negotiation of standard curb with rolling walker x3 trials. Initial trial with supervision/cueing for technique provided by this therapist. Final 2 trials with supervision/cueing appropriately provided by wife.   Pt performed multiple stand pivot transfers from bed<>w/c<>apartment couch with rolling walker and mod I. Performed floor transfer with apartment couch x1 trial with min guard due to pt anxiety, fear of onset of R knee pain. Pt/wife instructed to perform transfer only with hands-on assistance of wife. Pt/wife agreed. Performed static/dynamic standing activities; see below for detailed findings. Interactive discussion of safe home setup, safety precautions to prevent risk of falling. Session ended in pt room, where pt was left seated EOB with wife present and all needs within reach.  Treatment Session 2: Pt received seated EOB with wife present; agreeable to therapy. Session focused on assessing/addressing pt independence with bed mobility and stair negotiation. Performed supine<>sit, bilat rolling, and scooting to [facility] independently with HOB flat, no rails. Negotiated 9 stairs with bilat UE support at R rail, sideways, with step-to pattern and supervision of wife. Wife provided appropriate cues for safety awareness. Educated pt/wife on goals, pt progress, and discharge plan. Pt/wife verbalized understanding and were in full agreement with discharge plan. See below for detailed findings of discharge evaluation. Therapist departed with pt seated EOB with wife present and all needs within reach.  PT Discharge Precautions/Restrictions Precautions Precautions: None Restrictions hWeight Bearing Restrictions: No Vital Signs Therapy Vitals Pulse Rate: 72 BP: 129/76 mmHg Pain Pain Assessment Pain Assessment: 0-10 Pain Score: 1  Pain Type: Acute pain Pain Location: Knee Pain Orientation: Right Pain Descriptors / Indicators: Aching;Constant Pain Onset: On-going Pain Intervention(s):  Repositioned;Rest Multiple Pain Sites: No Vision/Perception  Vision - History Baseline Vision: Bifocals Patient Visual Report: No change from baseline Vision - Assessment Eye Alignment: Within Functional Limits Perception Perception: Within Functional Limits Praxis  Praxis: Intact  Cognition Overall Cognitive Status: Within Functional Limits for tasks assessed Arousal/Alertness: Awake/alert Orientation Level: Oriented X4 Attention: Alternating Focused Attention: Appears intact Alternating Attention: Appears intact Memory: Appears intact Awareness: Appears intact Problem Solving: Appears intact Organizing: Appears intact Safety/Judgment: Appears intact Sensation Sensation Light Touch: Appears Intact Stereognosis: Appears Intact Hot/Cold: Appears Intact Proprioception: Appears Intact Coordination Gross Motor Movements are Fluid and Coordinated: Yes Fine Motor Movements are Fluid and Coordinated: Yes Motor  Motor Motor: Within Functional Limits  Mobility Bed Mobility Bed Mobility: Rolling Right;Rolling Left;Sit to Supine;Sitting - Scoot to [employer] of Bed;Sit to Sidelying Left;Scooting to [facility] Rolling Right: 7: Independent Rolling Left: 7: Independent Supine to Sit: 7: Independent Sitting - Scoot to Edge of Bed: 7: Independent Sit to Supine: 7: Independent Scooting to HOB: 7: Independent Transfers Transfers: Yes Sit to Stand: From chair/3-in-1;From bed;With armrests;6: Modified independent (Device/Increase time) Stand to Sit: 6: Modified independent (Device/Increase time);With upper extremity assist;With armrests;To bed;To chair/3-in-1 Stand Pivot Transfers: 6: Modified independent (Device/Increase time);With armrests;Other (comment) Stand Pivot Transfer Details (indicate cue type and reason): with rolling walker Locomotion  Ambulation Ambulation: Yes Ambulation/Gait Assistance: 6: Modified independent (Device/Increase time) Ambulation Distance (Feet): 150  Feet Assistive device: Rolling walker Gait Gait Pattern: Impaired Gait Pattern: Decreased stance time - right;Decreased step length - left;Step-through pattern High Level Ambulation High Level Ambulation: Side stepping;Backwards walking Side Stepping: mod I with rolling walker Backwards Walking: mod I with rolling walker Wheelchair Mobility Distance: 250  Trunk/Postural Assessment  Cervical Assessment Cervical Assessment: Within Functional Limits Thoracic Assessment Thoracic Assessment: Within Functional Limits Lumbar Assessment Lumbar Assessment: Within Functional Limits Postural Control Postural Control: Within Functional Limits  Balance Balance Balance Assessed: Yes Static Sitting Balance Static Sitting - Balance Support: No upper extremity supported;Feet supported Static Sitting - Level of Assistance: 7: Independent Dynamic Sitting Balance Dynamic Sitting - Balance Support: Feet supported;No upper extremity supported;During functional activity Dynamic Sitting - Level of Assistance: 7: Independent Dynamic Sitting - Balance Activities: Lateral lean/weight shifting;Forward lean/weight shifting;Reaching for objects Static Standing Balance Static Standing - Balance Support: Left upper extremity supported Static Standing - Level of Assistance: 7: Independent Static Standing - Comment/# of Minutes: 3 minutes; no LOB Dynamic Standing Balance Dynamic Standing - Balance Support: Left upper extremity supported;During functional activity Dynamic Standing - Level of Assistance: 6: Modified independent (Device/Increase time) Dynamic Standing - Balance Activities: Forward lean/weight shifting;Lateral lean/weight shifting;Reaching across midline Extremity Assessment  RUE Assessment RUE Assessment: Within Functional Limits LUE Assessment LUE Assessment: Within Functional Limits RLE Assessment RLE Assessment: Exceptions to [facility] RLE AROM (degrees) Overall AROM Right Lower Extremity:  Within functional limits for tasks assessed RLE Strength RLE Overall Strength: Deficits;Due to pain Right Hip Flexion: 4/5 Right Knee Flexion: 3+/5 Right Knee Extension: 3/5 Right Ankle Dorsiflexion: 4/5 Right Ankle Plantar Flexion: 4/5 LLE Assessment LLE Assessment: Within Functional Limits  See FIM for current functional status  Hobble, Malva Cogan 11/16/2013, 12:16 PM

## 2013-11-16 NOTE — Progress Notes (Signed)
Social Work [name], LCSW Social Worker Signed  Patient Care Conference Service date: 11/15/2013 1:50 PM  Inpatient RehabilitationTeam Conference and Plan of Care Update Date: 11/15/2013   Time: 10;45 am     Patient Name: Jeff Henson       Medical Record Number: [id number]   Date of Birth: 02-16-1946 Sex: Male         Room/Bed: 4W24C/4W24C-01 Payor Info: Payor: MEDICARE / Plan: MEDICARE PART A / Product Type: *No Product type* /   Admitting Diagnosis: Deconditioned   Admit Date/Time:  11/10/2013  5:29 PM Admission Comments: No comment available   Primary Diagnosis:  <principal problem not specified> Principal Problem: <principal problem not specified>    Patient Active Problem List     Diagnosis  Date Noted   .  Atrial fibrillation  11/06/2013   .  Chronic diastolic heart failure  40/98/1191   .  Left ventricular dilatation  11/06/2013   .  HTN (hypertension)  11/06/2013   .  CKD (chronic kidney disease) stage 3, GFR 30-59 ml/min  11/06/2013   .  Retroperitoneal bleeding  11/04/2013   .  Acute blood loss anemia  11/04/2013   .  Coagulopathy  11/04/2013   .  History of pulmonary embolism  11/04/2013   .  DVT (deep venous thrombosis)  11/04/2013     Expected Discharge Date: Expected Discharge Date: 11/17/13  Team Members Present: Physician leading conference: Dr. Alysia Penna Social Worker Present: Ovidio Kin, LCSW Nurse Present: Other (comment) Maudry Mayhew Rosero-RN) PT Present: Georjean Mode, PT;Other (comment) Jilda Roche) OT Present: Gareth Morgan, Lorelee Cover, OT SLP Present: Germain Osgood, SLP PPS Coordinator present : Daiva Nakayama, RN, CRRN        Current Status/Progress  Goal  Weekly Team Focus   Medical     Right calf DVT, no progressive swelling, off warfarin for retroperitoneal hematoma  prevent progression of DVT  mobilize, repeat    Bowel/Bladder     Patient's continent of B&B; Calls when he wants to go to the bathroom. Voided at 0039  and was scanned for 123.   Patient to continue to self void  Self void goals   Swallow/Nutrition/ Hydration     No issues       ADL's     supervision overall and min assist with LB dressing  Mod I  safety awareness, activity tolerance, strengthening, functional transfers, education   Mobility     Mod I with bed mobility; supervision with transfers, gait; min A with dynamic standing balance  Independent with bed mobility; mod I with transfers, gait, and dynamic standing balance  Dynamic standing balance, safety awareness, gait/transfer training   Communication     [facility]       Safety/Cognition/ Behavioral Observations    No safety issues       Pain     PRN Tylenol 325-650mg  q 4 hrs, Tramadol 50mg  q 6 hrs. Denied pain  To maintain pain level at 2 or less  Assess pain q 4-6 hrs; reassess after intervention   Skin     Rash to back - hydrocortisone cream ordered. Buttock's red - EPBC is being used. Edema to R&L LE, R > L.; reddened area to R great toe.  Patient to remain free of infection and skin breakdown  Assess skin for breakdown; educate on the importance of skin care     *See Care Plan and progress notes for long and short-term goals.    Barriers to  Discharge:  needs repeat doppler      Possible Resolutions to Barriers:    see above      Discharge Planning/Teaching Needs:    Home with wife and daughter to assist some.  Wife here daily and participating in therapies with pt      Team Discussion: Voiding now-meeting mod/i level goals.  Off O2.  Follow up doppler on Friday could not be sooner-needs to be 5 days between. Family education being completed   Revisions to Treatment Plan:    Upgraded goals to mod/i level    Continued Need for Acute Rehabilitation Level of Care: The patient requires daily medical management by a physician with specialized training in physical medicine and rehabilitation for the following conditions: Daily direction of a multidisciplinary physical  rehabilitation program to ensure safe treatment while eliciting the highest outcome that is of practical value to the patient.: Yes Daily medical management of patient stability for increased activity during participation in an intensive rehabilitation regime.: Yes Daily analysis of laboratory values and/or radiology reports with any subsequent need for medication adjustment of medical intervention for : Other  [name] 11/16/2013, 2:44 PM          Patient ID: Jeff Henson, male   DOB: 03/25/1946, 68 y.o.   MRN: [id number]

## 2013-11-16 NOTE — Progress Notes (Signed)
Occupational Therapy Discharge Summary  Patient Details  Name: Jeff Henson MRN: [id number] Date of Birth: 08-28-1946  Today's Date: 11/16/2013 Time: 1000-1052 and 8563-1497 Time Calculation (min): 52 min and 42 min   Patient has met 9 of 9 long term goals due to improved activity tolerance, improved balance, postural control, improved attention and improved awareness.  Patient to discharge at overall Modified Independent level.  Patient's care partner is independent and and not necessary to provide the necessary physical and cognitive assistance at discharge.    Reasons goals not met: N/A. All LTGs met.   Recommendation:  Patient does not require skilled OT services following discharge home as patient has returned to baseline with self-care tasks. Patient and his wife agreeable.  Equipment: BSC, TTB  Reasons for discharge: treatment goals met and discharge from hospital  Patient/family agrees with progress made and goals achieved: Yes  Skilled Therapeutic Intervention Session 1: Pt seen for ADL retraining with focus on dynamic standing balance, activity tolerance, and safety awareness. Pt propelled self in w/c to ADL apartment for BUE. Completed bathing and dressing at Mod I level and all transfers at Mod I level. Pt required increased time for LB dressing especially socks. At end of session pt returned to room and completed all grooming tasks while sitting. Pt made Mod I in room/controlled environment. No questions or concerns from pt's wife or patient at this time. Wife present throughout therapy session.   Session 2: Pt received sitting EOB and ready for therapy session. Ambulated at Mod I level with RW from room to ADL apartment with 1 rest break. Completed simple meal prep task using microwave then transporting to kitchen table at Mod I level. Practiced using oven at Mod I level as well. Utilized teach back method for recall and demonstration of all safety techniques in kitchen. Pt  propelled self back to room for UE strength and activity tolerance. Pt then engaged in HEP using theraband at independent level while following handout. Pt and wife with no questions or concerns about discharge at this time.   OT Discharge Precautions/Restrictions  Precautions Precautions: None Restrictions Weight Bearing Restrictions: No General Amount of Missed OT Time (min): 8 Minutes Vital Signs Therapy Vitals Pulse Rate: 72 BP: 129/76 mmHg Pain Pain Assessment Pain Assessment: No/denies pain ADL   Vision/Perception  Vision - History Baseline Vision: Bifocals Patient Visual Report: No change from baseline Vision - Assessment Eye Alignment: Within Functional Limits Perception Perception: Within Functional Limits Praxis Praxis: Intact  Cognition Overall Cognitive Status: Within Functional Limits for tasks assessed Arousal/Alertness: Awake/alert Orientation Level: Oriented X4 Attention: Alternating Focused Attention: Appears intact Alternating Attention: Appears intact Memory: Appears intact Awareness: Appears intact Problem Solving: Appears intact Organizing: Appears intact Safety/Judgment: Appears intact Sensation Sensation Light Touch: Appears Intact Hot/Cold: Appears Intact Coordination Gross Motor Movements are Fluid and Coordinated: Yes Fine Motor Movements are Fluid and Coordinated: Yes Motor    Mobility     Trunk/Postural Assessment     Balance   Extremity/Trunk Assessment RUE Assessment RUE Assessment: Within Functional Limits LUE Assessment LUE Assessment: Within Functional Limits  See FIM for current functional status  Angelyn Osterberg N 11/16/2013, 10:55 AM

## 2013-11-16 NOTE — Progress Notes (Signed)
Right lower extremity venous duplex completed.  Right:  DVT noted in the peroneal vein.  No evidence of propagation into the popliteal vein.  No evidence of superficial thrombosis.  No Baker's cyst.

## 2013-11-16 NOTE — Discharge Summary (Signed)
Discharge summary job [id number]

## 2013-11-16 NOTE — Discharge Summary (Addendum)
NAMERAYEN, DAFOE NO.:  [id number]  MEDICAL RECORD NO.:  [id number]  LOCATION:  4W24C                        FACILITY:  [facility]  PHYSICIAN:  Charlett Blake, M.D.DATE OF BIRTH:  Aug 22, 1946  DATE OF ADMISSION:  11/10/2013 DATE OF DISCHARGE:                              DISCHARGE SUMMARY   DISCHARGE DIAGNOSES: 1. Deconditioning after retroperitoneal hematoma secondary to     anticoagulation. 2. Sequential compression devices for DVT prophylaxis and pain     management. 3. Gout. 4. Chronic renal insufficiency. 5. Atrial fibrillation. 6. Acute DVT right peroneal vein HISTORY OF PRESENT ILLNESS:  This is a 68 year old right-handed male with history of atrial fibrillation, pulmonary emboli maintained on Eliquis as well as chronic renal insufficiency, baseline creatinine 1.4. The patient independent driving prior to admission.  Admitted to [facility] on November 04, 2013, with chest pain and back pain.  CTA of the chest revealed retroperitoneal hemorrhage secondary to anticoagulation.  Eliquis the patient on prior to admission, advised to hold for 3-4 weeks.  Lower extremity Doppler showed no signs of DVT.  Atrial fibrillation maintained on Tikosyn. Complains of bilateral foot pain right greater than left.  X-ray showed no acute fractures.  There was some soft tissue swelling over the dorsal aspect of the right foot.  Uric acid level mildly elevated at 8.1.  The patient did receive a Depo-Medrol injection on November 09, 2013, placed on colchicine.  Physical and occupational therapy ongoing.  The patient was admitted for comprehensive rehab program.  PAST MEDICAL HISTORY:  See discharge diagnoses.  SOCIAL HISTORY:  Lives with spouse.  FUNCTIONAL HISTORY PRIOR TO ADMISSION:  Independent.  FUNCTIONAL STATUS UPON ADMISSION TO REHAB SERVICES:  +2 total assist to scoot to the head of the bed.  PHYSICAL EXAMINATION:  VITAL SIGNS:  Blood  pressure 177/78, pulse 97, temperature 93, respirations 20. GENERAL:  This was an alert male, oriented x3. HEENT:  Pupils were round and reactive to light. LUNGS:  Clear to auscultation. CARDIAC:  Rate irregularly irregular. ABDOMEN:  Soft, nontender.  Good bowel sounds. EXTREMITIES:  He did have some right foot dorsal tenderness with palpable pedal pulses.  Mild effusion right knee pain with active range of motion.  REHABILITATION HOSPITAL COURSE:  The patient was admitted to inpatient rehab services with therapies initiated on a 3-hour daily basis consisting of physical therapy, occupational therapy, and rehabilitation nursing.  The following issues were addressed during the patient's rehabilitation stay.  Pertaining to Mr. Gildner's deconditioning after retroperitoneal hematoma.  The patient would remain off his Eliquis he had been maintained on for atrial fibrillation for 3-4 weeks and then resume.  Hemoglobin and hematocrit remained stable.  Recent venous Doppler studies lower extremities were showed a right peroneal vein DVT on 11/12/2013 with repeat Dopplers 11/16/2013 unchanged without propagation of DVT thus patient remained monitored and again would resume eliquis in the upcoming weeks after recent retroperitoneal hematoma.  During his hospital course, he was treated for a gout flare up, right lower extremity.  Uric acid level 8.1.  He did receive a Depo-Medrol injection and maintained on colchicine with good results.  Chronic renal insufficiency monitored closely with latest creatinine 1.48.  He did have a history of atrial fibrillation.  He remained on Tikosyn as well as Toprol.  No chest pain or shortness of breath.  Cardiac rate remained controlled.  The patient received weekly collaborative interdisciplinary team conferences to discuss estimated length of stay, family teaching, and any barriers to discharge.  The patient was stand pivot bed to wheelchair  with supervision.  Perform wheelchair mobility in a controlled environment supervision.  The patient scored 36/56 on Berg balance scale.  He could ambulate 150 feet in a controlled environment using a rolling walker with supervision.  Issues of activities of daily living needing some assistance for lower body dressing, and occasional cues for safety which was advised to his wife for ongoing supervision.  Plan was to be discharged to home.  DISCHARGE MEDICATIONS: 1. Colchicine 0.6 mg p.o. b.i.d. 2. Voltaren gel 4 times daily to affected area. 3. Tikosyn 250 mg p.o. b.i.d. 4. Toprol-XL 200 mg p.o. daily. 5. Ultram 50 mg p.o. every 6 hours as needed pain.  DIET:  Regular.  SPECIAL INSTRUCTIONS:  The patient will follow up Dr. Alysia Penna at the outpatient rehab center as directed.  Case manager arranging primary care provider.  Special instructions; the patient was advised to hold Eliquis x4 weeks and then resume.     Lauraine Rinne, P.A.   ______________________________ Charlett Blake, M.D.    DA/MEDQ  D:  11/16/2013  T:  11/16/2013  Job:  [id number]

## 2013-11-16 NOTE — Progress Notes (Signed)
Subjective/Complaints: No foot pain, hx of R knee pain and hx of cortisone injections as well  Doppler unofficial report revealed R peroneal DVT, radiology report pending Has knee pain not calf pain, no thigh  , no SOB Objective: Vital Signs: Blood pressure 129/76, pulse 72, temperature 97.9 F (36.6 C), temperature source Oral, resp. rate 17, SpO2 91.00%. No results found. No results found for this or any previous visit (from the past 72 hour(s)).    Physical Exam  Constitutional: He is oriented to person, place, and time.  HENT:  Head: Normocephalic.  Eyes: EOM are normal.  Neck: Normal range of motion. Neck supple. No thyromegaly present.  Cardiovascular:  Cardiac rate controlled  Respiratory: Effort normal and breath sounds normal. No respiratory distress.  GI: Soft. Bowel sounds are normal. He exhibits no distension. There is no tenderness.  Musculoskeletal:  Right foot and dorsal tenderness with palpable pedal pulses Swelling R>L foot and ankle, tenderness with right great toe ROM   Mild effusion R knee pain with AROM/PROM Mild pain but no swelling L knee  Neurological: He is alert and oriented to person, place, and time. No sensory deficit.  Strength is 5/5 in bilateral deltoid, bicep, tricep, grip 3 minus/5 Right knee extensor, 2 minus in the ankle dorsiflexor and plantar flexor, 2 minus in the hip flexor all limited by pain, Left HF,KE,ADF 4/5  Skin: Skin is warm and dry       Assessment/Plan: 1. Functional deficits secondary to deconditioning retroperitoneal hematoma which require 3+ hours per day of interdisciplinary therapy in a comprehensive inpatient rehab setting. Physiatrist is providing close team supervision and 24 hour management of active medical problems listed below. Physiatrist and rehab team continue to assess barriers to discharge/monitor patient progress toward functional and medical goals. FIM: FIM - Bathing Bathing Steps Patient Completed:  Chest;Right Arm;Left Arm;Abdomen;Front perineal area;Buttocks;Right upper leg;Left upper leg;Right lower leg (including foot);Left lower leg (including foot) Bathing: 5: Supervision: Safety issues/verbal cues (verbal cues)  FIM - Upper Body Dressing/Undressing Upper body dressing/undressing steps patient completed: Thread/unthread right sleeve of pullover shirt/dresss;Thread/unthread left sleeve of pullover shirt/dress;Put head through opening of pull over shirt/dress;Pull shirt over trunk Upper body dressing/undressing: 5: Set-up assist to: Obtain clothing/put away FIM - Lower Body Dressing/Undressing Lower body dressing/undressing steps patient completed: Thread/unthread right underwear leg;Pull underwear up/down;Thread/unthread right pants leg;Pull pants up/down;Thread/unthread left underwear leg;Thread/unthread left pants leg;Don/Doff left sock;Don/Doff right sock Lower body dressing/undressing: 5: Set-up assist to: Obtain clothing  FIM - Toileting Toileting steps completed by patient: Adjust clothing prior to toileting;Performs perineal hygiene;Adjust clothing after toileting Toileting Assistive Devices: Grab bar or rail for support Toileting: 5: Supervision: Safety issues/verbal cues  FIM - Radio producer Devices: Insurance account manager Transfers: 5-To toilet/BSC: Supervision (verbal cues/safety issues);5-From toilet/BSC: Supervision (verbal cues/safety issues)  FIM - Control and instrumentation engineer Devices: Walker;Arm rests Bed/Chair Transfer: 5: Bed > Chair or W/C: Supervision (verbal cues/safety issues)  FIM - Locomotion: Wheelchair Distance: 150 Locomotion: Wheelchair: 5: Travels 150 ft or more: maneuvers on rugs and over door sills with supervision, cueing or coaxing FIM - Locomotion: Ambulation Locomotion: Ambulation Assistive Devices: Administrator Ambulation/Gait Assistance: 5: Supervision Locomotion: Ambulation: 5: Travels 150 ft or  more with supervision/safety issues  Comprehension Comprehension Mode: Auditory Comprehension: 5-Understands complex 90% of the time/Cues < 10% of the time  Expression Expression Mode: Verbal Expression: 5-Expresses complex 90% of the time/cues < 10% of the time  Social Interaction Social Interaction: 5-Interacts appropriately  90% of the time - Needs monitoring or encouragement for participation or interaction.  Problem Solving Problem Solving: 5-Solves basic problems: With no assist  Memory Memory: 4-Recognizes or recalls 75 - 89% of the time/requires cueing 10 - 24% of the time   Medical Problem List and Plan:  1. Deconditioning after a retroperitoneal hematoma felt to be secondary to anti-coagulation.Eliquis to be held 3-4 weeks  2. DVT Prophylaxis/Anticoagulation: SCDs. +Right calf DVT, no progressive swelling 3. Pain Management: Ultram as needed. Monitor with increased mobility, knee pain appears gout related, increase colchicine and add diclofenac gel 4. Neuropsych: This patient is capable of making decisions on his own behalf.  5. Suspect gout right lower extremity. Uric acid level 8.1. Received Depo-Medrol injection times one. Placed on colchicine 11/10/2013 overall improved but also suspect OA  +/- patellar tendinitis, decrease colchicine to Q daily, cont diclofenac gel 6. Chronic renal insufficiency. Baseline creatinine 1 point and monitor. Repeat chemistries On colchicine 7. Atrial fibrillation. Continue Tikosyn 250 mcg twice a day, Toprol-XL 200 mg daily. Again Eliquis to be held 3-4 weeks secondary to retroperitoneal hematoma. Cardiac rate remains stable  8.  R peroneal DVT, per official report , discussed with Dr Donnetta Hutching,  just peroneal and not popliteal can repeat doppler on tomorrow and allow full activity LOS (Days) 6 A FACE TO FACE EVALUATION WAS [place] E 11/16/2013, 8:03 AM

## 2013-11-17 MED ORDER — TRAMADOL HCL 50 MG PO TABS: 50.0000 mg | ORAL_TABLET | Freq: Four times a day (QID) | ORAL | Status: AC | PRN

## 2013-11-17 MED ORDER — DICLOFENAC SODIUM 1 % TD GEL: 2.0000 g | Freq: Four times a day (QID) | TRANSDERMAL | Status: AC

## 2013-11-17 MED ORDER — COLCHICINE 0.6 MG PO TABS: 0.6000 mg | ORAL_TABLET | Freq: Every day | ORAL | Status: AC

## 2013-11-17 MED ORDER — METOPROLOL SUCCINATE ER 200 MG PO TB24: 200.0000 mg | ORAL_TABLET | Freq: Every day | ORAL | Status: AC

## 2013-11-17 MED ORDER — DOFETILIDE 250 MCG PO CAPS: 250.0000 ug | ORAL_CAPSULE | Freq: Two times a day (BID) | ORAL | Status: AC

## 2013-11-17 NOTE — Progress Notes (Signed)
Pt discharged to home, accompanied by his wife.

## 2013-11-17 NOTE — Progress Notes (Addendum)
Subjective/Complaints:  Doppler unofficial report revealed R peroneal DVT,no propagation since 1/4 Has knee pain not calf pain, no thigh  , no SOB Patient has a history of DVT and PE. We discussed resumption of anticoagulant in about 2 weeks. In the meantime important to increased mobility and activity level Objective: Vital Signs: Blood pressure 133/79, pulse 72, temperature 98.1 F (36.7 C), temperature source Oral, resp. rate 18, SpO2 95.00%. No results found. No results found for this or any previous visit (from the past 72 hour(s)).    Physical Exam  Constitutional: He is oriented to person, place, and time.  HENT:  Head: Normocephalic.  Eyes: EOM are normal.  Neck: Normal range of motion. Neck supple. No thyromegaly present.  Cardiovascular:  Cardiac rate controlled  Respiratory: Effort normal and breath sounds normal. No respiratory distress.  GI: Soft. Bowel sounds are normal. He exhibits no distension. There is no tenderness.  Musculoskeletal:  Right foot and dorsal tenderness with palpable pedal pulses Swelling R>L foot and ankle, tenderness with right great toe ROM   Mild effusion R knee pain with AROM/PROM Mild pain but no swelling L knee  Neurological: He is alert and oriented to person, place, and time. No sensory deficit.  Strength is 5/5 in bilateral deltoid, bicep, tricep, grip 3 minus/5 Right knee extensor, 2 minus in the ankle dorsiflexor and plantar flexor, 2 minus in the hip flexor all limited by pain, Left HF,KE,ADF 4/5  Skin: Skin is warm and dry       Assessment/Plan: 1. Functional deficits secondary to deconditioning retroperitoneal hematoma  Stable for D/C today F/u PCP in 1-2 weeks F/u PM&R 3 weeks See D/C summary See D/C instructions FIM: FIM - Bathing Bathing Steps Patient Completed: Chest;Right Arm;Left Arm;Abdomen;Front perineal area;Buttocks;Right upper leg;Left upper leg;Right lower leg (including foot);Left lower leg (including  foot) Bathing: 6: More than reasonable amount of time  FIM - Upper Body Dressing/Undressing Upper body dressing/undressing steps patient completed: Thread/unthread right sleeve of pullover shirt/dresss;Thread/unthread left sleeve of pullover shirt/dress;Put head through opening of pull over shirt/dress;Pull shirt over trunk Upper body dressing/undressing: 7: Complete Independence: No helper FIM - Lower Body Dressing/Undressing Lower body dressing/undressing steps patient completed: Thread/unthread right underwear leg;Pull underwear up/down;Thread/unthread right pants leg;Pull pants up/down;Thread/unthread left underwear leg;Thread/unthread left pants leg;Don/Doff left sock;Don/Doff right sock Lower body dressing/undressing: 6: More than reasonable amount of time  FIM - Toileting Toileting steps completed by patient: Adjust clothing prior to toileting;Performs perineal hygiene;Adjust clothing after toileting Toileting Assistive Devices: Grab bar or rail for support Toileting: 6: More than reasonable amount of time  FIM - Radio producer Devices: Insurance account manager Transfers: 6-Assistive device: No helper  FIM - Control and instrumentation engineer Devices: Environmental consultant;Arm rests Bed/Chair Transfer: 7: Supine > Sit: No assist;7: Sit > Supine: No assist;6: Bed > Chair or W/C: No assist;6: Chair or W/C > Bed: No assist  FIM - Locomotion: Wheelchair Distance: 250 Locomotion: Wheelchair: 6: Travels 150 ft or more, turns around, maneuvers to table, bed or toilet, negotiates 3% grade: maneuvers on rugs and over door sills independently FIM - Locomotion: Ambulation Locomotion: Ambulation Assistive Devices: Administrator Ambulation/Gait Assistance: 6: Modified independent (Device/Increase time) Locomotion: Ambulation: 6: Travels 150 ft or more with assistive device/no helper  Comprehension Comprehension Mode: Auditory Comprehension: 5-Understands basic 90% of the  time/requires cueing < 10% of the time  Expression Expression Mode: Verbal Expression: 5-Expresses basic 90% of the time/requires cueing < 10% of the time.  Social Interaction Social Interaction: 5-Interacts appropriately 90% of the time - Needs monitoring or encouragement for participation or interaction.  Problem Solving Problem Solving: 5-Solves basic problems: With no assist  Memory Memory: 4-Recognizes or recalls 75 - 89% of the time/requires cueing 10 - 24% of the time   Medical Problem List and Plan:  1. Deconditioning after a retroperitoneal hematoma felt to be secondary to anti-coagulation.Eliquis to be held 3-4 weeks  2. DVT Prophylaxis/Anticoagulation: SCDs. +Right calf DVT, no progressive swelling 3. Pain Management: Ultram as needed. Monitor with increased mobility, knee pain appears gout related, increase colchicine and add diclofenac gel 4. Neuropsych: This patient is capable of making decisions on his own behalf.  5. Suspect gout right lower extremity. Uric acid level 8.1. Received Depo-Medrol injection times one. Placed on colchicine 11/10/2013 overall improved but also suspect OA  +/- patellar tendinitis, decrease colchicine to Q daily, cont diclofenac gel 6. Chronic renal insufficiency. Follow up with PCP as outpatient On colchicine 7. Atrial fibrillation. Continue Tikosyn 250 mcg twice a day, Toprol-XL 200 mg daily. Again Eliquis to be held 3-4 weeks secondary to retroperitoneal hematoma. Cardiac rate remains stable  8.  R peroneal DVT, just peroneal and not popliteal no extension on repeat doppler, cont to mobilize, will resume Eliquis in 2 wks LOS (Days) 7 A FACE TO FACE EVALUATION WAS PERFORMED  Jeff Henson E 11/17/2013, 10:27 AM

## 2013-11-17 NOTE — Progress Notes (Signed)
Social Work Discharge Note Discharge Note  The overall goal for the admission was met for:   Discharge location: Yes-HOME WITH WIFE WHO CAN PROVIDE SUPERVISION LEVEL  Length of Stay: Yes-8 DAYS  Discharge activity level: Yes-MOD/I LEVEL  Home/community participation: Yes  Services provided included: MD, RD, PT, OT, RN, Pharmacy and SW  Financial Services: Medicare and Private Insurance: [place]  Follow-up services arranged: Home Health: [place] [facility]-PT, DME: [place] and Patient/Family has no preference for HH/DME agencies  Comments (or additional information):FAMILY EDUCATION COMPLETED, TO GET [facility].  PLEASED WITH PROGRESS AND DISCHARGE TODAY  Patient/Family verbalized understanding of follow-up arrangements: Yes  Individual responsible for coordination of the follow-up plan: SELF & MARY-WIFE  Confirmed correct DME delivered: [name] 11/17/2013    [name]

## 2013-11-17 NOTE — Discharge Instructions (Signed)
Inpatient Rehab Discharge Instructions  Jeff Henson Discharge date and time: No discharge date for patient encounter.   Activities/Precautions/ Functional Status: Activity: activity as tolerated Diet: regular diet Wound Care: none needed Functional status:  ___ No restrictions     ___ Walk up steps independently ___ 24/7 supervision/assistance   ___ Walk up steps with assistance ___ Intermittent supervision/assistance  ___ Bathe/dress independently ___ Walk with walker     ___ Bathe/dress with assistance ___ Walk Independently    ___ Shower independently _x__ Walk with assistance    ___ Shower with assistance ___ No alcohol     ___ Return to work/school ________  Special Instructions: Hold ELIQUIS x3 weeks and then resume secondary to retroperitoneal hematoma  COMMUNITY REFERRALS UPON DISCHARGE:    Home Health:   [place] AVWPV:[phone] Date of last service:11/17/2013  Medical Equipment/Items Ordered:WIDE ROLLING WALKER & [place]   My questions have been answered and I understand these instructions. I will adhere to these goals and the provided educational materials after my discharge from the hospital.  Patient/Caregiver Signature _______________________________ Date __________  Clinician Signature _______________________________________ Date __________  Please bring this form and your medication list with you to all your follow-up doctor's appointments.

## 2013-12-08 DIAGNOSIS — I129 Hypertensive chronic kidney disease with stage 1 through stage 4 chronic kidney disease, or unspecified chronic kidney disease: Secondary | ICD-10-CM

## 2013-12-08 DIAGNOSIS — I4891 Unspecified atrial fibrillation: Secondary | ICD-10-CM

## 2013-12-08 DIAGNOSIS — K661 Hemoperitoneum: Secondary | ICD-10-CM

## 2013-12-08 DIAGNOSIS — I824Z9 Acute embolism and thrombosis of unspecified deep veins of unspecified distal lower extremity: Secondary | ICD-10-CM

## 2013-12-11 ENCOUNTER — Telehealth: Payer: Self-pay

## 2013-12-11 NOTE — Telephone Encounter (Signed)
OK to DC

## 2013-12-11 NOTE — Telephone Encounter (Signed)
FYI:: Juanita @ [facility] called and stated patient requested to be discharged early from PT and nursing services. [place] states patient is doing very well and driving on his own, so they honored his request. She said if this was not okay with you then they would finish PT- which would be 2 more visits.

## 2014-11-21 IMAGING — CT CT ANGIO AORTIC DISSECTION W/ CM
3 of 6 series · 5 of 16 positions shown, 6 images · IV contrast (APPLIED)
Comparison: CT of the chest on 08/30/2013.

CLINICAL DATA: Left-sided chest pain and back pain. History of
pulmonary embolism.

EXAM:
CT ANGIOGRAPHY CHEST WITH CONTRAST
TECHNIQUE: Multidetector CT imaging of the chest was performed using the
standard protocol during bolus administration of intravenous
contrast. Multiplanar CT image reconstructions including MIPs were
obtained to evaluate the vascular anatomy.
CONTRAST:  100 mL Isovue 370 IV

[Series 6: arterial · axial · arterial · 0.74mm/px · z∈[-628,-168]mm · 3 of 231 slices shown, 4 images]
[im 1/231  soft-tissue]
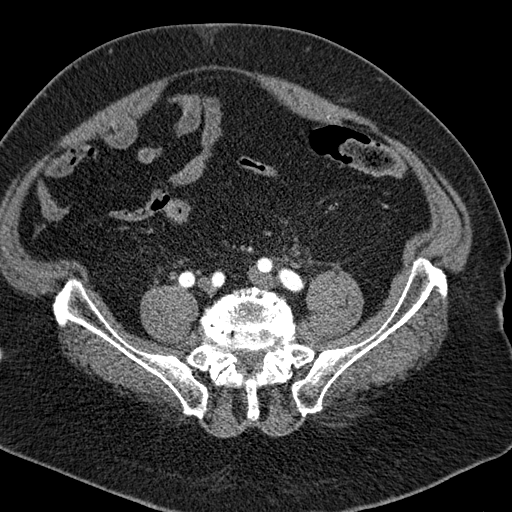
[im 1/231  bone]
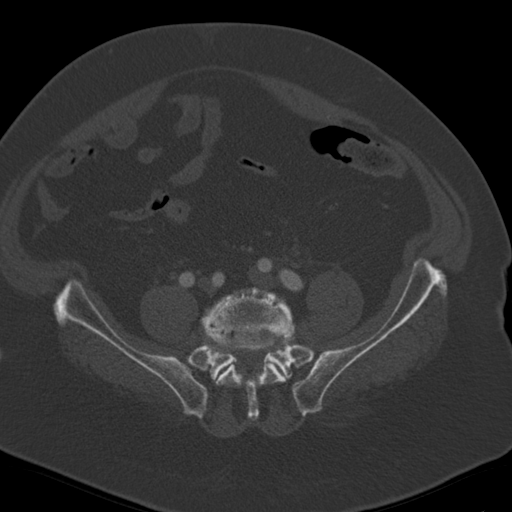
[im 116/231  soft-tissue]
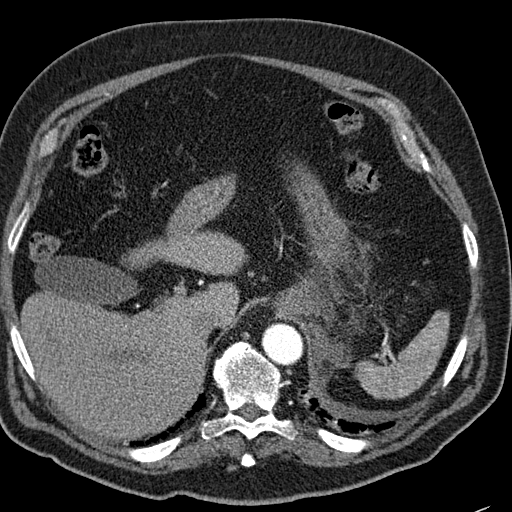
[im 231/231  soft-tissue]
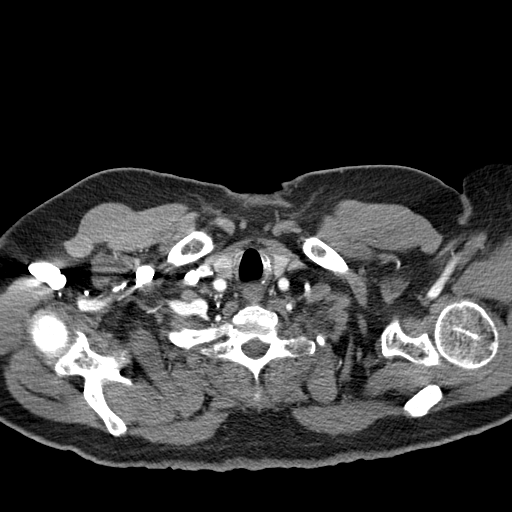

[Series 8: cor arterial mpr · coronal · arterial · 0.78mm/px · 1 of 182 slices shown]
[im 91/182  soft-tissue]
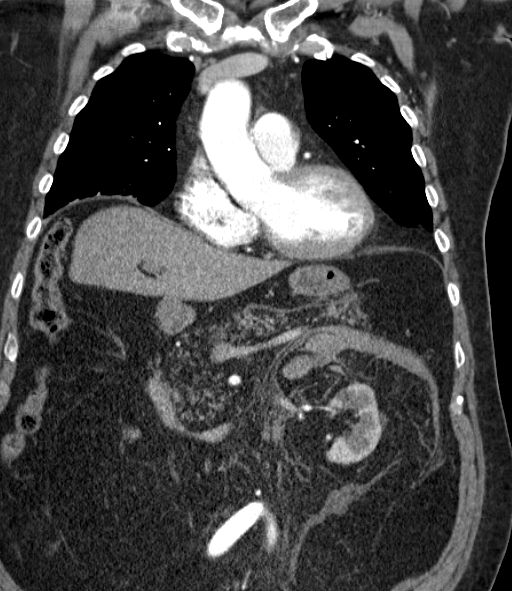

[Series 9: sag arterial mpr · sagittal · arterial · 0.76mm/px · 1 of 205 slices shown]
[im 103/205  soft-tissue]
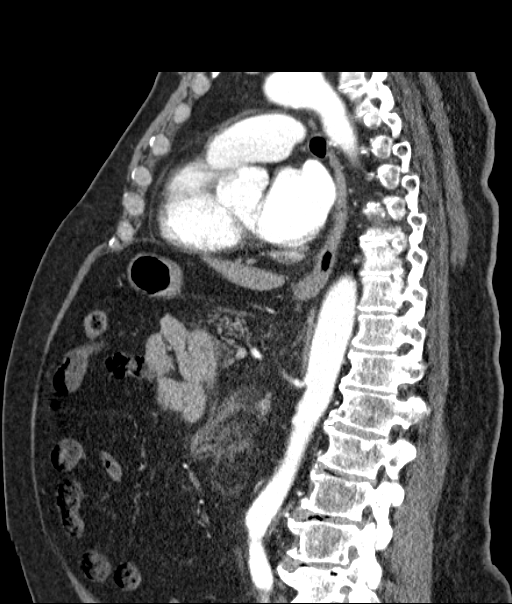

[5 of 16 positions shown; findings below may reference images not displayed]

FINDINGS: The dominant acute abnormality is a large retroperitoneal hemorrhage
in the left upper abdomen beginning just inferior to the stomach and
involving the left adrenal gland. The hematoma measures roughly 15 x
7 x 5 cm. The origin of hemorrhage is felt to be along the posterior
margin of the left adrenal gland with a focus of hyperdense contrast
present measuring 10 mm and probable extravasation of contrast
anterior to this focus. On the CT of the chest in [REDACTED], there is
a focal area of intense enhancement of the posterior adrenal gland
present measuring approximately 10 mm. This represents either a
hyperdense enhancing nodular lesion or pseudoaneurysm of an adrenal
arterial branch.

In the visualized upper abdomen, the splenic artery appears normal
and intact. No pseudoaneurysm or bleed is seen involving the renal
artery or pancreatic branches.

The thoracic aorta is well opacified and shows no evidence of
aneurysm or dissection. Pulmonary arteries are also very well
opacified and there is no evidence of pulmonary embolism. Lungs show
bibasilar atelectasis. No pleural or pericardial fluid is seen. The
heart size is normal. No masses or enlarged lymph nodes are seen.
Degenerative changes are present in the thoracic spine.

Review of the MIP images confirms the above findings.
IMPRESSION: Large left upper quadrant abdominal retroperitoneal hemorrhage as
described which appears to be emanating from the posterior aspect of
the left adrenal gland. Extravasation of contrast is seen anterior
to a hyperdense focus of contrast enhancement measuring 10 mm and
felt to represent either a hypervascular lesion or branch vessel
pseudoaneurysm of the adrenal gland. This is visible on a prior CT
in [REDACTED] and showed no evidence of surrounding hemorrhage at that
time.

Critical Value/emergent results were called by telephone at the time
of interpretation on 11/04/2013 at [DATE] to Dr. ES TIGER
, who verbally acknowledged these results.

## 2014-11-25 IMAGING — US US RENAL
1 series · 14 of 25 positions shown · non-contrast
Comparison: None.

CLINICAL DATA: Acute renal failure.  Hypertension.  Morbid obesity.

EXAM:
RENAL/URINARY TRACT ULTRASOUND COMPLETE

[Series 1: us renal · 0.28mm/px · 42 acquisitions, 14 frames shown]
[im 1/42]
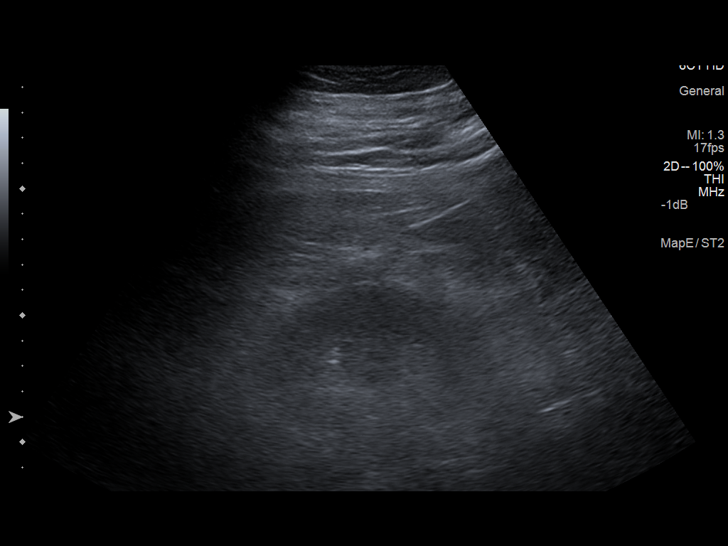
[im 4/42]
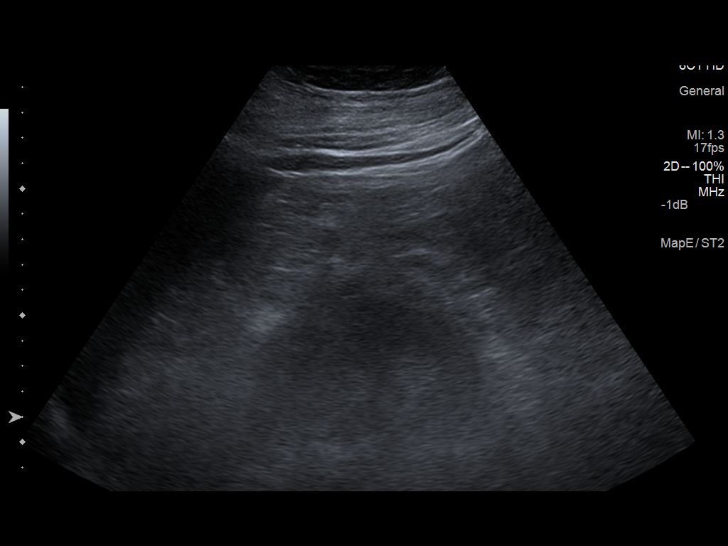
[im 7/42]
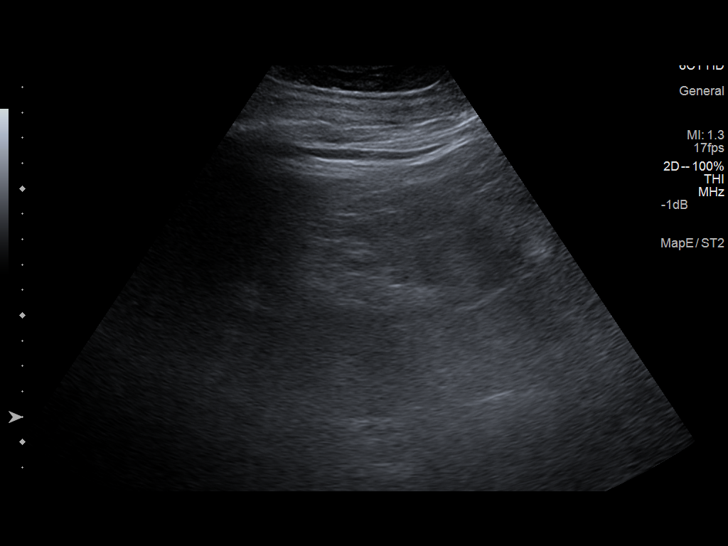
[im 11/42]
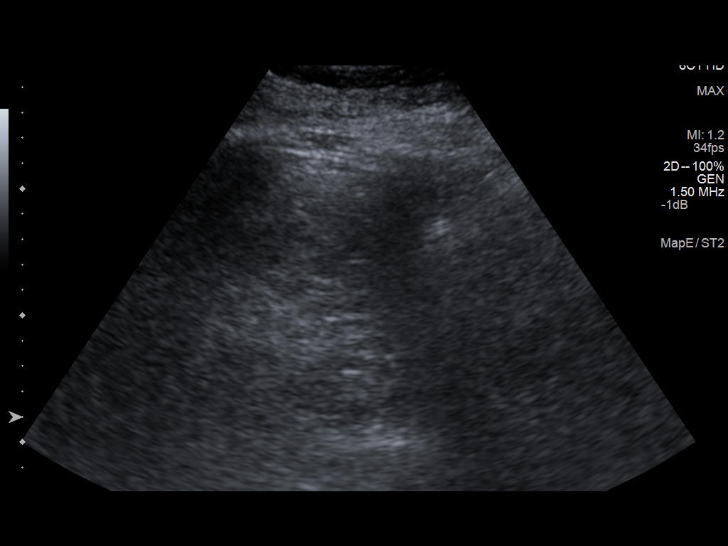
[im 14/42]
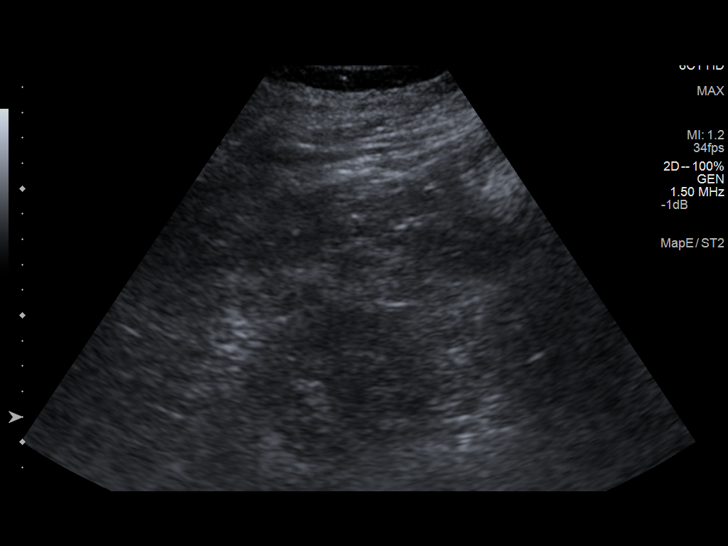
[im 16/42]
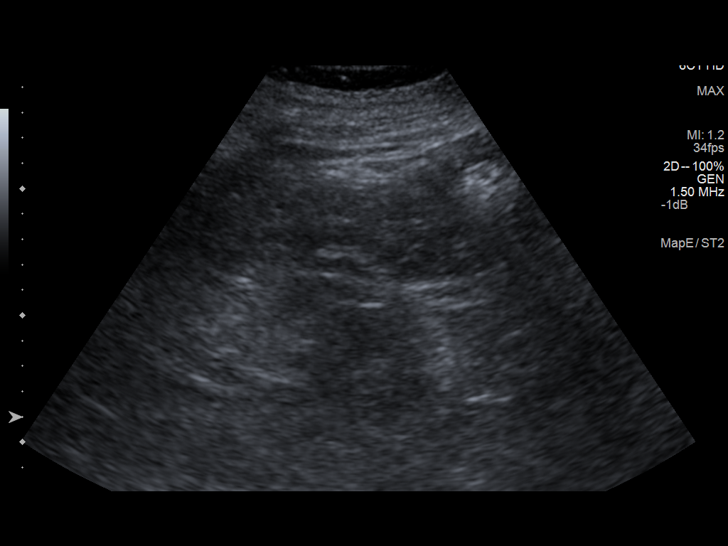
[im 19/42]
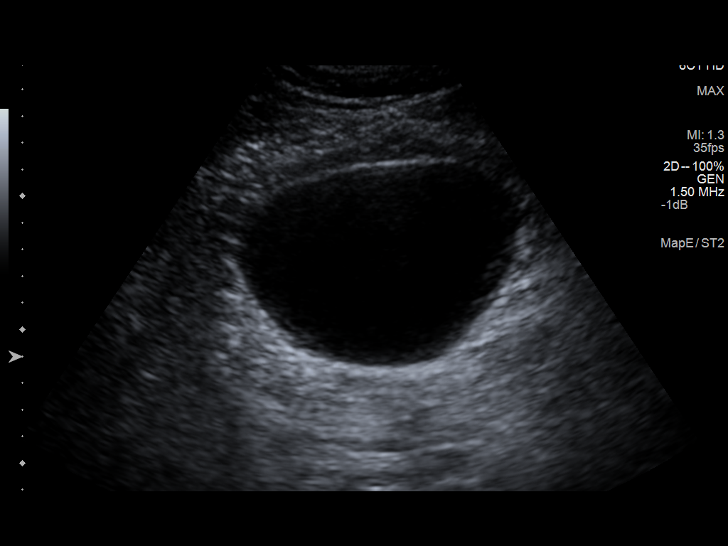
[im 23/42]
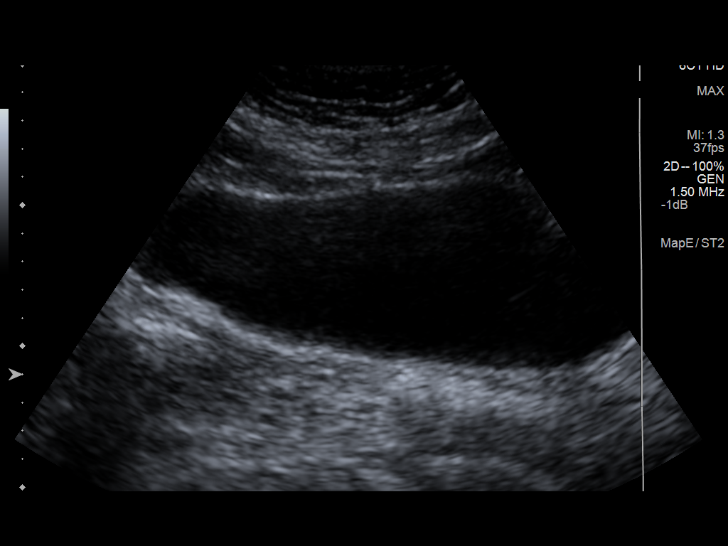
[im 26/42]
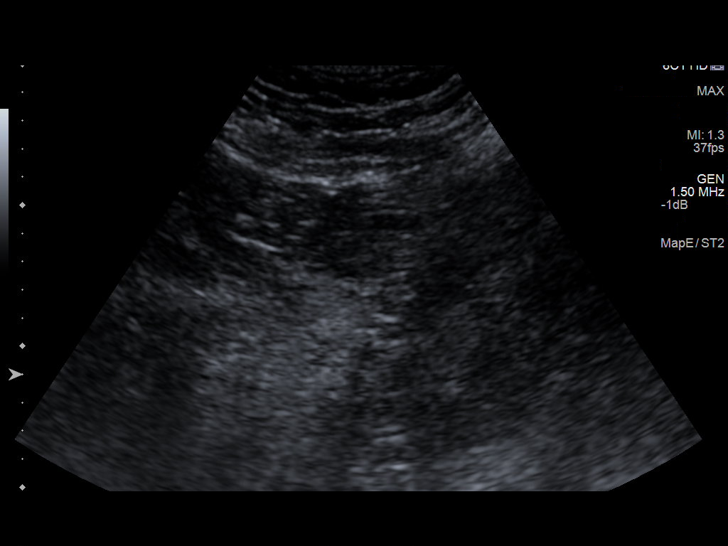
[im 28/42]
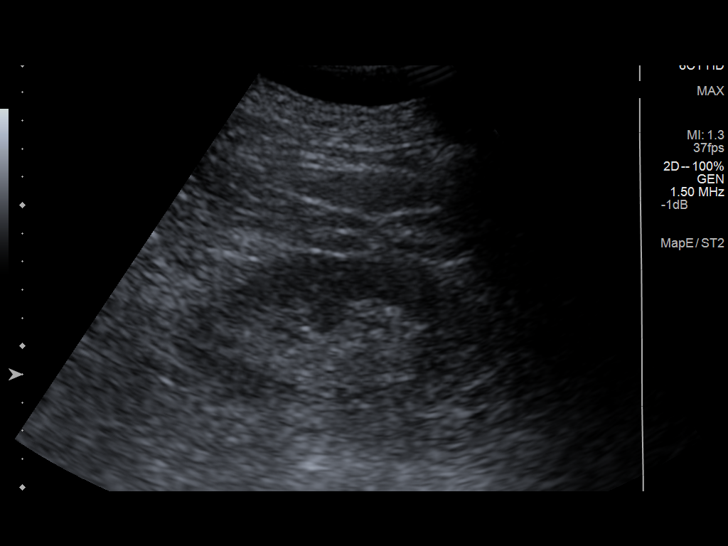
[im 31/42]
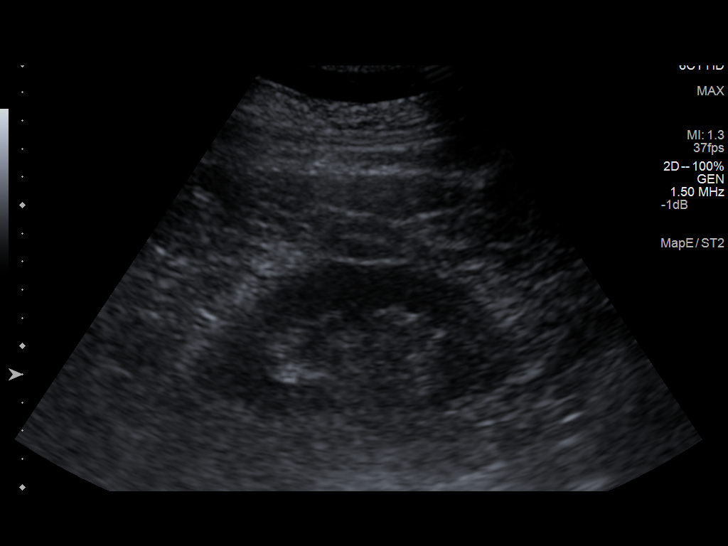
[im 35/42]
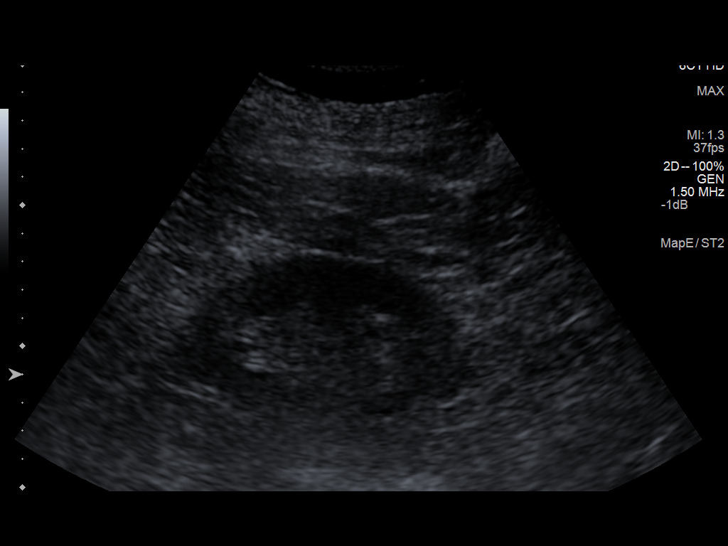
[im 38/42]
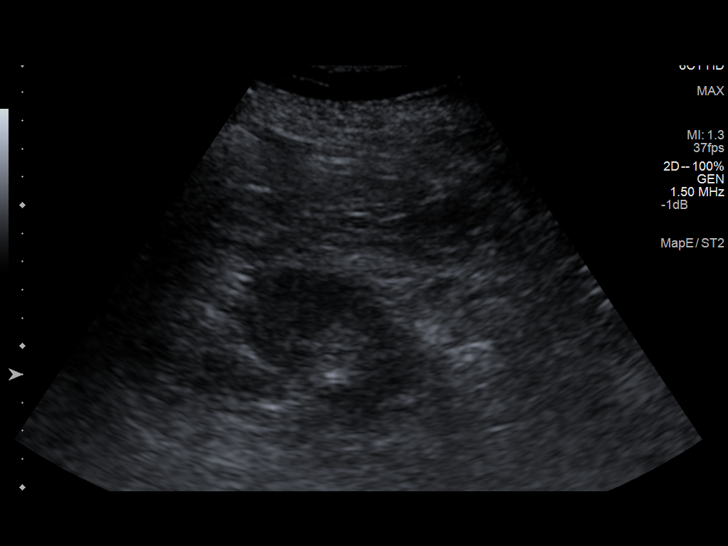
[im 42/42]
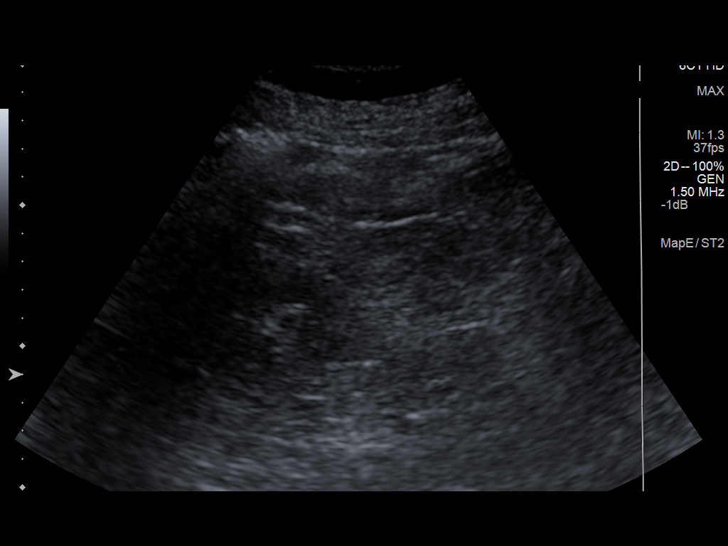

[14 of 25 positions shown; findings below may reference images not displayed]

FINDINGS: Right Kidney:

Length: 10 cm.. Evaluation limited secondary to patient's habitus.
No hydronephrosis or obvious mass.

Left Kidney:

Length: 10.9 cm.. Echogenicity within normal limits. No mass or
hydronephrosis visualized.

Bladder:

Bladder diverticulum incidentally noted.
IMPRESSION: Examination limited by patient's habitus. This particularly limits
evaluation of the right kidney. No obvious hydronephrosis or renal
mass identified.

Bladder diverticulum.

## 2018-11-09 DEATH — deceased
